# Patient Record
Sex: Male | Born: 1990
Health system: Southern US, Community
[De-identification: ages and names within clinical notes are randomized; demographics above are authoritative.]

## PROBLEM LIST (undated history)

## (undated) DIAGNOSIS — U071 COVID-19: Secondary | ICD-10-CM

## (undated) DIAGNOSIS — R7303 Prediabetes: Secondary | ICD-10-CM

## (undated) DIAGNOSIS — I10 Essential (primary) hypertension: Secondary | ICD-10-CM

## (undated) DIAGNOSIS — M109 Gout, unspecified: Secondary | ICD-10-CM

## (undated) HISTORY — DX: Gout, unspecified: M10.9

## (undated) HISTORY — DX: COVID-19: U07.1

## (undated) HISTORY — DX: Essential (primary) hypertension: I10

## (undated) HISTORY — DX: Prediabetes: R73.03

## (undated) HISTORY — PX: NO PAST SURGERIES: SHX2092

## (undated) HISTORY — DX: Morbid (severe) obesity due to excess calories: E66.01

---

## 2011-01-20 ENCOUNTER — Emergency Department: Payer: Self-pay | Admitting: Emergency Medicine

## 2018-07-21 ENCOUNTER — Emergency Department
Admission: EM | Admit: 2018-07-21 | Discharge: 2018-07-21 | Disposition: A | Discharge status: Home / Self Care | Payer: BLUE CROSS/BLUE SHIELD | Attending: Emergency Medicine | Admitting: Emergency Medicine

## 2018-07-21 ENCOUNTER — Encounter: Payer: Self-pay | Admitting: Emergency Medicine

## 2018-07-21 ENCOUNTER — Other Ambulatory Visit: Payer: Self-pay

## 2018-07-21 ENCOUNTER — Emergency Department: Payer: BLUE CROSS/BLUE SHIELD

## 2018-07-21 DIAGNOSIS — R0602 Shortness of breath: Secondary | ICD-10-CM | POA: Diagnosis not present

## 2018-07-21 DIAGNOSIS — I10 Essential (primary) hypertension: Secondary | ICD-10-CM | POA: Diagnosis not present

## 2018-07-21 DIAGNOSIS — Z8679 Personal history of other diseases of the circulatory system: Secondary | ICD-10-CM

## 2018-07-21 DIAGNOSIS — M10071 Idiopathic gout, right ankle and foot: Secondary | ICD-10-CM | POA: Insufficient documentation

## 2018-07-21 DIAGNOSIS — M79671 Pain in right foot: Secondary | ICD-10-CM | POA: Diagnosis not present

## 2018-07-21 DIAGNOSIS — M25476 Effusion, unspecified foot: Secondary | ICD-10-CM | POA: Diagnosis not present

## 2018-07-21 DIAGNOSIS — I509 Heart failure, unspecified: Secondary | ICD-10-CM

## 2018-07-21 LAB — CBC WITH DIFFERENTIAL/PLATELET
Abs Immature Granulocytes: 0.05 10*3/uL (ref 0.00–0.07)
Basophils Absolute: 0.1 10*3/uL (ref 0.0–0.1)
Basophils Relative: 0 %
Eosinophils Absolute: 0.2 10*3/uL (ref 0.0–0.5)
Eosinophils Relative: 2 %
HCT: 33.3 % — ABNORMAL LOW (ref 39.0–52.0)
Hemoglobin: 10.2 g/dL — ABNORMAL LOW (ref 13.0–17.0)
Immature Granulocytes: 0 %
Lymphocytes Relative: 24 %
Lymphs Abs: 2.9 10*3/uL (ref 0.7–4.0)
MCH: 23.2 pg — ABNORMAL LOW (ref 26.0–34.0)
MCHC: 30.6 g/dL (ref 30.0–36.0)
MCV: 75.9 fL — ABNORMAL LOW (ref 80.0–100.0)
Monocytes Absolute: 1.1 10*3/uL — ABNORMAL HIGH (ref 0.1–1.0)
Monocytes Relative: 10 %
Neutro Abs: 7.5 10*3/uL (ref 1.7–7.7)
Neutrophils Relative %: 64 %
Platelets: 313 10*3/uL (ref 150–400)
RBC: 4.39 MIL/uL (ref 4.22–5.81)
RDW: 16.5 % — ABNORMAL HIGH (ref 11.5–15.5)
WBC: 11.9 10*3/uL — ABNORMAL HIGH (ref 4.0–10.5)
nRBC: 0 % (ref 0.0–0.2)

## 2018-07-21 LAB — BASIC METABOLIC PANEL
Anion gap: 8 (ref 5–15)
BUN: 24 mg/dL — ABNORMAL HIGH (ref 6–20)
CO2: 27 mmol/L (ref 22–32)
Calcium: 8.8 mg/dL — ABNORMAL LOW (ref 8.9–10.3)
Chloride: 102 mmol/L (ref 98–111)
Creatinine, Ser: 1.51 mg/dL — ABNORMAL HIGH (ref 0.61–1.24)
GFR calc Af Amer: >60 mL/min (ref >60)
GFR calc non Af Amer: >60 mL/min (ref >60)
Glucose, Bld: 86 mg/dL (ref 70–99)
Potassium: 3.4 mmol/L — ABNORMAL LOW (ref 3.5–5.1)
Sodium: 137 mmol/L (ref 135–145)

## 2018-07-21 LAB — URIC ACID: Uric Acid, Serum: 9.1 mg/dL — ABNORMAL HIGH (ref 3.7–8.6)

## 2018-07-21 LAB — BRAIN NATRIURETIC PEPTIDE: B Natriuretic Peptide: 71 pg/mL (ref 0.0–100.0)

## 2018-07-21 MED ORDER — AMLODIPINE BESYLATE 5 MG PO TABS: 5.0000 mg | ORAL_TABLET | Freq: Every day | ORAL | 2 refills | Status: DC

## 2018-07-21 MED ORDER — HYDROCODONE-ACETAMINOPHEN 5-325 MG PO TABS: 1.0000 | ORAL_TABLET | Freq: Four times a day (QID) | ORAL | 0 refills | Status: AC | PRN

## 2018-07-21 MED ORDER — COLCHICINE 0.6 MG PO TABS
1.2000 mg | ORAL_TABLET | Freq: Once | ORAL | Status: AC
  Administered 2018-07-21: 1.2 mg via ORAL
  Filled 2018-07-21: qty 2

## 2018-07-21 MED ORDER — PREDNISONE 20 MG PO TABS: 20.0000 mg | ORAL_TABLET | Freq: Two times a day (BID) | ORAL | 0 refills | Status: AC

## 2018-07-21 MED ORDER — HYDROCODONE-ACETAMINOPHEN 5-325 MG PO TABS
1.0000 | ORAL_TABLET | Freq: Once | ORAL | Status: AC
  Administered 2018-07-21: 1 via ORAL
  Filled 2018-07-21: qty 1

## 2018-07-21 MED ORDER — COLCHICINE 0.6 MG PO TABS: ORAL_TABLET | ORAL | 0 refills | Status: DC

## 2018-07-21 NOTE — ED Triage Notes (Signed)
Patient presents to the ED with left foot swelling and pain since Friday.  Patient states he noticed swelling a few days prior.  Patient has a scratch to the top of his left foot that he states is a dog scratch from approx. 2 weeks ago.  Scratch does not appear reddened and does not have any drainage.

## 2018-07-21 NOTE — ED Provider Notes (Signed)
-----------------------------------------   6:47 PM on 07/21/2018 -----------------------------------------  This patient was seen with PA Menshew.  I personally evaluated the patient.  Patient presents with left foot pain, primarily to the dorsum of the foot and around the base of the great toe over the last several days and worse with bearing weight.  There is some swelling there.  He reports mild swelling on the other side but this is not significant.  He has no pain or swelling to the remainder of the leg.  On exam there is tenderness and some swelling to the dorsum of the foot and around the base of the great toe.  There is no erythema or induration.  He has no fluctuance or drainage, or any open wounds.  The remainder of the leg is nontender.  Overall I suspect most likely gout although we will obtain labs to help differentiate between this and possible cellulitis.  X-ray shows no fracture or other traumatic findings.  ----------------------------------------- 10:55 PM on 07/21/2018 -----------------------------------------  Lab work-up suggestive of gout.  The patient was sent home with medications for gout.  Return precautions given.   ED ECG REPORT I, Dionne Bucy, the attending physician, personally viewed and interpreted this ECG.  Date: 07/21/2018 EKG Time: 1629 Rate: 84 Rhythm: normal sinus rhythm QRS Axis: normal Intervals: normal ST/T Wave abnormalities: normal Narrative Interpretation: no evidence of acute ischemia    Dionne Bucy, MD 07/21/18 2256

## 2018-07-21 NOTE — ED Notes (Signed)
Pt instructed about medications for hypertension and gout.

## 2018-07-21 NOTE — ED Notes (Signed)
First Nurse Note: Pt to ED from Urgent Care for possible new onset heart failure. Pt is in NAD at this time.

## 2018-07-21 NOTE — ED Provider Notes (Signed)
[facility] Emergency Department Provider Note ____________________________________________  Time seen: 1555  I have reviewed the triage vital signs and the nursing notes.  HISTORY  Chief Complaint  Foot Swelling  HPI Darrell Campbell is a 28 y.o. male since himself to the ED for evaluation of sudden left foot pain noted 2 days ago.  Patient denies any trauma, injury, accident, or contusion.  He reports pain to the medial aspect of the foot at the great toe.  He has a new puppy, and had a scratch to the top of the foot, but does not endorse that the 2 are related.  He presents to the ED for evaluation of his foot pain.  Patient with a remote history of uncontrolled high blood pressure and questionable CHF/peripheral edema.  He has been without medical insurance for the last 5 years, and has had no interim medical care.  He presents now rating pain to the foot at an 8 out of 10 at this time.  He denies any fevers, chills, sweats, chest pain, or shortness of breath.  History reviewed. No pertinent past medical history.  There are no active problems to display for this patient.  History reviewed. No pertinent surgical history.  Prior to Admission medications   Not on File    Allergies Patient has no known allergies.  No family history on file.  Social History Social History   Tobacco Use  . Smoking status: Never Smoker  . Smokeless tobacco: Never Used  Substance Use Topics  . Alcohol use: Not on file  . Drug use: Not on file    Review of Systems  Constitutional: Negative for fever. Eyes: Negative for visual changes. ENT: Negative for sore throat. Cardiovascular: Negative for chest pain. Respiratory: Negative for shortness of breath. Gastrointestinal: Negative for abdominal pain, vomiting and diarrhea. Genitourinary: Negative for dysuria. Musculoskeletal: Negative for back pain.  Left foot pain as above. Skin: Negative for rash. Neurological:  Negative for headaches, focal weakness or numbness. ____________________________________________  PHYSICAL EXAM:  VITAL SIGNS: ED Triage Vitals  Enc Vitals Group     BP 07/21/18 1427 (!) 189/119     Pulse Rate 07/21/18 1427 89     Resp 07/21/18 1427 16     Temp 07/21/18 1427 98.7 F (37.1 C)     Temp Source 07/21/18 1427 Oral     SpO2 07/21/18 1427 98 %     Weight 07/21/18 1427 (!) 505 lb (229.1 kg)     Height 07/21/18 1427 6' (1.829 m)     Head Circumference --      Peak Flow --      Pain Score 07/21/18 1437 8     Pain Loc --      Pain Edu? --      Excl. in GC? --     Constitutional: Alert and oriented. Well appearing and in no distress. Head: Normocephalic and atraumatic. Eyes: Conjunctivae are normal. Normal extraocular movements Cardiovascular: Normal rate, regular rhythm. Normal distal pulses. Respiratory: Normal respiratory effort. No wheezes/rales/rhonchi. Gastrointestinal: Soft and nontender. No distention. Musculoskeletal: Nontender with normal range of motion in all extremities.  Neurologic:  Normal gait without ataxia. Normal speech and language. No gross focal neurologic deficits are appreciated. Skin:  Skin is warm, dry and intact. No rash noted. Psychiatric: Mood and affect are normal. Patient exhibits appropriate insight and judgment. ____________________________________________   LABS (pertinent positives/negatives) Labs Reviewed  BASIC METABOLIC PANEL - Abnormal; Notable for the following components:  Result Value   Potassium 3.4 (*)    BUN 24 (*)    Creatinine, Ser 1.51 (*)    Calcium 8.8 (*)    All other components within normal limits  CBC WITH DIFFERENTIAL/PLATELET - Abnormal; Notable for the following components:   WBC 11.9 (*)    Hemoglobin 10.2 (*)    HCT 33.3 (*)    MCV 75.9 (*)    MCH 23.2 (*)    RDW 16.5 (*)    Monocytes Absolute 1.1 (*)    All other components within normal limits  URIC ACID - Abnormal; Notable for the following  components:   Uric Acid, Serum 9.1 (*)    All other components within normal limits  BRAIN NATRIURETIC PEPTIDE  ____________________________________________  EKG  NSR 84 bpm PR Interval 150 ms QRS duration 96 ____________________________________________   RADIOLOGY  CXR  IMPRESSION: Cardiomegaly with pulmonary vascular congestion.  Right Foot  IMPRESSION: No fracture or dislocation is noted. Dorsal soft tissue swelling is seen suggesting infection or injury. ____________________________________________  PROCEDURES  Procedures ____________________________________________  INITIAL IMPRESSION / ASSESSMENT AND PLAN / ED COURSE  Darrell Campbell was evaluated in Emergency Department on 07/21/2018 for the symptoms described in the history of present illness. He was evaluated in the context of the global COVID-19 pandemic, which necessitated consideration that the patient might be at risk for infection with the SARS-CoV-2 virus that causes COVID-19. Institutional protocols and algorithms that pertain to the evaluation of patients at risk for COVID-19 are in a state of rapid change based on information released by regulatory bodies including the CDC and federal and state organizations. These policies and algorithms were followed during the patient's care in the ED.  Patient presents to the ED for evaluation of right foot pain for 2 days.  Patient's clinical exam is concerning for cellulitis versus acute gouty arthritis.  Labs are reassuring except an elevated uric acid of 9.1.  Patient will be treated empirically for acute gouty arthritis with a prescription for colchicine.  He is also given advised to select and follow with the primary provider for routine medical care including management of his blood pressure, fluid retention, and suspected  CHF.  ____________________________________________  FINAL CLINICAL IMPRESSION(S) / ED DIAGNOSES  Final diagnoses:  Acute idiopathic gout of  right foot      Karmen StabsMenshew, Charlesetta IvoryJenise V Bacon, PA-C 07/21/18 1901    Dionne BucySiadecki, Sebastian, MD 07/21/18 2254

## 2018-07-21 NOTE — Discharge Instructions (Addendum)
Your foot pain is due to gout. Take the prescription meds as directed. Select & Follow-up with a primary provider for routine medical care.

## 2018-07-30 ENCOUNTER — Other Ambulatory Visit: Payer: Self-pay

## 2018-07-30 ENCOUNTER — Ambulatory Visit (INDEPENDENT_AMBULATORY_CARE_PROVIDER_SITE_OTHER): Payer: Self-pay | Admitting: Family Medicine

## 2018-07-30 ENCOUNTER — Encounter: Payer: Self-pay | Admitting: Family Medicine

## 2018-07-30 ENCOUNTER — Telehealth: Payer: Self-pay | Admitting: Family Medicine

## 2018-07-30 DIAGNOSIS — I10 Essential (primary) hypertension: Secondary | ICD-10-CM | POA: Insufficient documentation

## 2018-07-30 DIAGNOSIS — M109 Gout, unspecified: Secondary | ICD-10-CM

## 2018-07-30 MED ORDER — COLCHICINE 0.6 MG PO TABS: 0.6000 mg | ORAL_TABLET | Freq: Every day | ORAL | 2 refills | Status: DC

## 2018-07-30 NOTE — Progress Notes (Signed)
Patient ID: Darrell Campbell, male   DOB: 07-29-1990, 28 y.o.   MRN: [id number]    Virtual Visit via video Note  This visit type was conducted due to national recommendations for restrictions regarding the COVID-19 pandemic (e.g. social distancing).  This format is felt to be most appropriate for this patient at this time.  All issues noted in this document were discussed and addressed.  No physical exam was performed (except for noted visual exam findings with Video Visits).   I connected with Darrell Campbell today at 11:20 AM EDT by a video enabled telemedicine application and verified that I am speaking with the correct person using two identifiers. Location patient: home Location provider: [facility] Persons participating in the virtual visit: patient, provider  I discussed the limitations, risks, security and privacy concerns of performing an evaluation and management service by video and the availability of in person appointments. I also discussed with the patient that there may be a patient responsible charge related to this service. The patient expressed understanding and agreed to proceed.  HPI:  Patient not connected via video for patient to establish with PCP.  Patient has not had a primary care provider in a little over 6 or 7 years.  He had previously been on blood pressure medication, but lost medical insurance and was unable to provide medical care in his medication.  Last week he went to the emergency department due to painful right foot, and was diagnosed with gout in right foot.  He was treated with course of colchicine which did seem to help but got improved.  He was advised by provider in ER to take colchicine once daily after the first dose of colchicine to treat gout if pain began to research.  Pain in right foot slowly begin to come back, so he is begun taking colchicine once daily and this seems to have helped keep pain in right foot at day and he is back to walking  almost normally without pain.  When in the ER it was also discovered his blood pressure was quite elevated.  Patient unsure of what medication he took in the past, but was started on amlodipine 5 mg daily by the ER physician.  Patient also has family history of high blood pressure in both mother and father.  Patient denies any chest pain or palpitations.  Denies headache.  Denies shortness of breath or wheezing.  Does have leg swelling at times, ER doctor suspected possibly underlying CHF as well as uncontrolled blood pressure.  Lab work and EKG done in ER reviewed.  Patient does not have BP cuff at home and is unable to check his blood pressure for me today.   ROS:    Constitutional: Negative for chills, fatigue and fever.  HENT: Negative for congestion, ear pain, sinus pain and sore throat.   Eyes: Negative.   Respiratory: Negative for cough, shortness of breath and wheezing.   Cardiovascular: Negative for chest pain, palpitations and leg swelling.  Gastrointestinal: Negative for abdominal pain, diarrhea, nausea and vomiting.  Genitourinary: Negative for dysuria, frequency and urgency.  Musculoskeletal: Pain in right foot, slowly improving.   Skin: Negative for color change, pallor and rash.  Neurological: Negative for syncope, light-headedness and headaches.  Psychiatric/Behavioral: The patient is not nervous/anxious.     Past Medical History:  Diagnosis Date  . Gout   . Hypertension     History reviewed. No pertinent surgical history.  Family History  Problem Relation Age  of Onset  . Bronchitis Mother   . Hypertension Father    Social History   Tobacco Use  . Smoking status: Never Smoker  . Smokeless tobacco: Never Used  Substance Use Topics  . Alcohol use: Yes    Comment: socially    Current Outpatient Medications:  .  amLODipine (NORVASC) 5 MG tablet, Take 1 tablet (5 mg total) by mouth daily., Disp: 30 tablet, Rfl: 2 .  colchicine 0.6 MG tablet, Take 1 tablet  (0.6 mg total) by mouth daily., Disp: 30 tablet, Rfl: 2  EXAM:  Wt Readings from Last 3 Encounters:  07/21/18 (!) 505 lb (229.1 kg)   BP Readings from Last 3 Encounters:  07/21/18 (!) 185/108   GENERAL: alert, oriented, appears well and in no acute distress  HEENT: atraumatic, conjunttiva clear, no obvious abnormalities on inspection of external nose and ears  NECK: normal movements of the head and neck  LUNGS: on inspection no signs of respiratory distress, breathing rate appears normal, no obvious gross SOB, gasping or wheezing  CV: no obvious cyanosis  MS: moves all visible extremities without noticeable abnormality  PSYCH/NEURO: pleasant and cooperative, no obvious depression or anxiety, speech and thought processing grossly intact  ASSESSMENT AND PLAN:  Discussed the following assessment and plan:  Gout of foot, unspecified cause, unspecified chronicity, unspecified laterality - Plan: colchicine 0.6 MG tablet, DISCONTINUED: colchicine 0.6 MG tablet  Essential hypertension - Plan: DME Other see comment  Morbid obesity (HCC)  For now patient will continue colchicine once daily to help keep gout pain under control.  He will continue amlodipine 5 mg daily for blood pressure control.  We will have him come in the office next week for an office visit so we can check his blood pressure and recheck blood work so we can compare results to labs checked in the emergency department.  Also discussed healthy diet and physical activity with goal of weight loss and better control of BP. I also sent over Rx for BP cuff so patient can check BP at home. Recommended diet full of lean protein such as chicken and fish, lots of green vegetables and fruits and low carbs and low processed sugars.  Discussed physical activity such as going for walks and also some weight training beginning with small hand weights and building up stamina over time.   I discussed the assessment and treatment plan with  the patient. The patient was provided an opportunity to ask questions and all were answered. The patient agreed with the plan and demonstrated an understanding of the instructions.   The patient was advised to call back or seek an in-person evaluation if the symptoms worsen or if the condition fails to improve as anticipated.  Patient will follow-up in office next week so we can check blood pressure, do in person exam & get labs   Tracey Harries, FNP

## 2018-07-30 NOTE — Telephone Encounter (Signed)
Appt scheduled

## 2018-07-30 NOTE — Telephone Encounter (Signed)
Please schedule him on Tuesday 08/06/2018 at 1120 AM for in office visit with me and we will also do labs than day  Thanks  LG

## 2018-08-06 ENCOUNTER — Other Ambulatory Visit: Payer: Self-pay

## 2018-08-06 ENCOUNTER — Telehealth: Payer: Self-pay

## 2018-08-06 ENCOUNTER — Other Ambulatory Visit: Payer: Self-pay | Admitting: Family Medicine

## 2018-08-06 ENCOUNTER — Telehealth: Payer: Self-pay | Admitting: Family Medicine

## 2018-08-06 ENCOUNTER — Ambulatory Visit (INDEPENDENT_AMBULATORY_CARE_PROVIDER_SITE_OTHER): Payer: BLUE CROSS/BLUE SHIELD | Admitting: Family Medicine

## 2018-08-06 ENCOUNTER — Encounter: Payer: Self-pay | Admitting: Family Medicine

## 2018-08-06 DIAGNOSIS — I10 Essential (primary) hypertension: Secondary | ICD-10-CM

## 2018-08-06 DIAGNOSIS — D649 Anemia, unspecified: Secondary | ICD-10-CM

## 2018-08-06 DIAGNOSIS — Z713 Dietary counseling and surveillance: Secondary | ICD-10-CM

## 2018-08-06 DIAGNOSIS — E559 Vitamin D deficiency, unspecified: Secondary | ICD-10-CM

## 2018-08-06 DIAGNOSIS — M109 Gout, unspecified: Secondary | ICD-10-CM

## 2018-08-06 DIAGNOSIS — D509 Iron deficiency anemia, unspecified: Secondary | ICD-10-CM

## 2018-08-06 DIAGNOSIS — E538 Deficiency of other specified B group vitamins: Secondary | ICD-10-CM

## 2018-08-06 LAB — CBC WITH DIFFERENTIAL/PLATELET
Basophils Absolute: 0.1 10*3/uL (ref 0.0–0.1)
Basophils Relative: 0.8 % (ref 0.0–3.0)
Eosinophils Absolute: 0.2 10*3/uL (ref 0.0–0.7)
Eosinophils Relative: 2 % (ref 0.0–5.0)
HCT: 34.2 % — ABNORMAL LOW (ref 39.0–52.0)
Hemoglobin: 10.9 g/dL — ABNORMAL LOW (ref 13.0–17.0)
Lymphocytes Relative: 23 % (ref 12.0–46.0)
Lymphs Abs: 2.2 10*3/uL (ref 0.7–4.0)
MCHC: 31.8 g/dL (ref 30.0–36.0)
MCV: 75.4 fl — ABNORMAL LOW (ref 78.0–100.0)
Monocytes Absolute: 0.8 10*3/uL (ref 0.1–1.0)
Monocytes Relative: 8.3 % (ref 3.0–12.0)
Neutro Abs: 6.2 10*3/uL (ref 1.4–7.7)
Neutrophils Relative %: 65.9 % (ref 43.0–77.0)
Platelets: 252 10*3/uL (ref 150.0–400.0)
RBC: 4.54 Mil/uL (ref 4.22–5.81)
RDW: 17.9 % — ABNORMAL HIGH (ref 11.5–15.5)
WBC: 9.4 10*3/uL (ref 4.0–10.5)

## 2018-08-06 LAB — BASIC METABOLIC PANEL
BUN: 19 mg/dL (ref 6–23)
CO2: 32 mEq/L (ref 19–32)
Calcium: 9.3 mg/dL (ref 8.4–10.5)
Chloride: 101 mEq/L (ref 96–112)
Creatinine, Ser: 1.55 mg/dL — ABNORMAL HIGH (ref 0.40–1.50)
GFR: 63.91 mL/min (ref >60.00)
Glucose, Bld: 88 mg/dL (ref 70–99)
Potassium: 3.8 mEq/L (ref 3.5–5.1)
Sodium: 140 mEq/L (ref 135–145)

## 2018-08-06 LAB — LIPID PANEL
Cholesterol: 183 mg/dL (ref 0–200)
HDL: 45.6 mg/dL (ref >39.00)
LDL Cholesterol: 121 mg/dL — ABNORMAL HIGH (ref 0–99)
NonHDL: 137.4
Total CHOL/HDL Ratio: 4
Triglycerides: 83 mg/dL (ref 0.0–149.0)
VLDL: 16.6 mg/dL (ref 0.0–40.0)

## 2018-08-06 LAB — TSH: TSH: 3.01 u[IU]/mL (ref 0.35–4.50)

## 2018-08-06 LAB — IBC + FERRITIN
Ferritin: 71.6 ng/mL (ref 22.0–322.0)
Iron: 30 ug/dL — ABNORMAL LOW (ref 42–165)
Saturation Ratios: 10.8 % — ABNORMAL LOW (ref 20.0–50.0)
Transferrin: 198 mg/dL — ABNORMAL LOW (ref 212.0–360.0)

## 2018-08-06 LAB — HEPATIC FUNCTION PANEL
ALT: 21 U/L (ref 0–53)
AST: 12 U/L (ref 0–37)
Albumin: 3.8 g/dL (ref 3.5–5.2)
Alkaline Phosphatase: 86 U/L (ref 39–117)
Bilirubin, Direct: 0.1 mg/dL (ref 0.0–0.3)
Total Bilirubin: 0.4 mg/dL (ref 0.2–1.2)
Total Protein: 7.3 g/dL (ref 6.0–8.3)

## 2018-08-06 LAB — HEMOGLOBIN A1C: Hgb A1c MFr Bld: 6 % (ref 4.6–6.5)

## 2018-08-06 LAB — B12 AND FOLATE PANEL
Folate: 5 ng/mL — ABNORMAL LOW (ref >5.9)
Vitamin B-12: 225 pg/mL (ref 211–911)

## 2018-08-06 LAB — VITAMIN D 25 HYDROXY (VIT D DEFICIENCY, FRACTURES): VITD: <7 ng/mL — ABNORMAL LOW (ref 30.00–100.00)

## 2018-08-06 LAB — URIC ACID: Uric Acid, Serum: 8.7 mg/dL — ABNORMAL HIGH (ref 4.0–7.8)

## 2018-08-06 MED ORDER — HYDROCHLOROTHIAZIDE 25 MG PO TABS: 25.0000 mg | ORAL_TABLET | Freq: Every day | ORAL | 2 refills | Status: DC

## 2018-08-06 MED ORDER — METOPROLOL SUCCINATE ER 50 MG PO TB24: 50.0000 mg | ORAL_TABLET | Freq: Every day | ORAL | 2 refills | Status: DC

## 2018-08-06 MED ORDER — AMLODIPINE BESYLATE 10 MG PO TABS: 10.0000 mg | ORAL_TABLET | Freq: Every day | ORAL | 2 refills | Status: DC

## 2018-08-06 NOTE — Telephone Encounter (Signed)
Visit supposed to be in person  We need to check his BP and get labs  Please call to confirm in person, covid screening

## 2018-08-06 NOTE — Telephone Encounter (Signed)
Copied from CRM [id number]. Topic: General - Other >> Aug 06, 2018 11:15 AM Jay Schlichter wrote: Reason for CRM: pt called to let us know he is running 5 mins late for his 1120 appt.  He said he will not be more than 10 mins late  Attempted to call office, no answer

## 2018-08-06 NOTE — Progress Notes (Signed)
Rx for BP meds  See office visit note

## 2018-08-06 NOTE — Progress Notes (Signed)
Subjective:    Patient ID: Darrell Campbell, male    DOB: 1990-10-11, 28 y.o.   MRN: [id number]  HPI   Patient presents to clinic today for follow-up on blood pressure after being on amlodipine for a little over a week and also for follow-up on foot pain.  Patient states he is tolerating the amlodipine without any issues.  Denies any increased leg swelling or palpitations/chest pain.  Right foot pain has improved.  He is taking colchicine at low dose daily.  Patient Active Problem List   Diagnosis Date Noted  . Essential hypertension 07/30/2018  . Morbid obesity (HCC) 07/30/2018   Social History   Tobacco Use  . Smoking status: Never Smoker  . Smokeless tobacco: Never Used  Substance Use Topics  . Alcohol use: Yes    Comment: socially   Family History  Problem Relation Age of Onset  . Bronchitis Mother   . Hypertension Father     Review of Systems  Constitutional: Negative for chills, fatigue and fever.  HENT: Negative for congestion, ear pain, sinus pain and sore throat.   Eyes: Negative.   Respiratory: Negative for cough, shortness of breath and wheezing.   Cardiovascular: Negative for chest pain, palpitations and leg swelling.  Gastrointestinal: Negative for abdominal pain, diarrhea, nausea and vomiting.  Genitourinary: Negative for dysuria, frequency and urgency.  Musculoskeletal: Negative for arthralgias and myalgias. Pain in R foot improved.  Skin: Negative for color change, pallor and rash.  Neurological: Negative for syncope, light-headedness and headaches.  Psychiatric/Behavioral: The patient is not nervous/anxious.       Objective:   Physical Exam Vitals signs and nursing note reviewed.  Constitutional:      General: He is not in acute distress.    Appearance: He is obese. He is not ill-appearing or diaphoretic.  HENT:     Head: Normocephalic and atraumatic.  Eyes:     General: No scleral icterus.    Extraocular Movements: Extraocular movements  intact.     Pupils: Pupils are equal, round, and reactive to light.  Cardiovascular:     Rate and Rhythm: Regular rhythm. Tachycardia present.     Heart sounds: No murmur. No friction rub. No gallop.   Pulmonary:     Effort: Pulmonary effort is normal. No respiratory distress.     Breath sounds: Normal breath sounds.  Musculoskeletal:     Comments: ROM feet/ankles/toes normal  Skin:    General: Skin is warm and dry.     Coloration: Skin is not jaundiced or pale.     Findings: No erythema or rash.  Neurological:     General: No focal deficit present.     Mental Status: He is alert and oriented to person, place, and time.     Gait: Gait normal.  Psychiatric:        Mood and Affect: Mood normal.        Behavior: Behavior normal.        Thought Content: Thought content normal.    Vitals:   08/06/18 1156 08/06/18 1200  BP: (!) 188/130 (!) 170/112  Pulse: (!) 112   Resp: 20   Temp: 99.9 F (37.7 C)   SpO2: 94%    Wt Readings from Last 3 Encounters:  08/06/18 (!) 510 lb 9.6 oz (231.6 kg)  07/21/18 (!) 505 lb (229.1 kg)   Body mass index is 73.26 kg/m.     Assessment & Plan:    A total of 40  minutes were spent face-to-face with the patient during this encounter and over half of that time was spent on counseling and coordination of care. The patient was counseled on blood pressure medication changes, weight loss strategies and diet plan to follow.  Morbid obesity- Long discussion with patient regards to his weight and how weight loss will help improve his overall health.  Discussed a diet high in lean proteins, lots of vegetables and lower in sugars and carbs.  Patient given outline of a diet plan to try and follow that will help promote weight loss.  Patient aware that if he continues at his current weight, he most likely will develop more and more chronic medical issues.  Hypertension-BP continues to remain elevated even with amlodipine 5 mg.  We will increase dose to 10  mg/day.  We will also add on hydrochlorothiazide 25 mg daily as well as Toprol 50 mg daily.  Anemia- patient's blood work in the ER did reveal some anemia.  Patient reports a history of iron deficiency.  We will repeat labs and also include iron panel.  Gout of right foot-pain in right foot improved.  We will follow-up uric acid level with blood work and he will continue Colcrys as prescribed.  Patient will follow-up in here in 2 weeks for recheck on blood pressure with addition of new medications.

## 2018-08-06 NOTE — Patient Instructions (Signed)
Increase Amlodipine to 10 mg per day  Add HCTZ 25 mg daily for swelling and BP control  Add Toprol XL 50 mg daily for BP  This is  Dr. Melina Schools  example of a  "Low GI"  Diet:  It will allow you to lose 4 to 8  lbs  per month if you follow it carefully.  Your goal with exercise is a minimum of 30 minutes of aerobic exercise 5 days per week (Walking does not count once it becomes easy!)     All of the foods can be found at grocery stores and in bulk at [employer].  The Atkins protein bars and shakes are available in more varieties at Target, [employer] and [employer].       7 AM Breakfast:  Choose from the following:             Low carbohydrate Protein  Shakes (I recommend the  Premier Protein chocolate shakes,  EAS AdvantEdge "Carb Control" shakes  Or the Atkins shakes all are under 3 net carbs)                           a scrambled egg/bacon/cheese burrito made with Mission's "carb balance" whole wheat tortilla  (about 10 net carbs )             Medical laboratory scientific officer (basically a quiche without the pastry crust) that is eaten cold and very convenient way to get your eggs.  8 carbs)             If you make your own protein shakes, avoid bananas and pineapple,  And use low carb greek yogurt or original /unsweetened almond or soy milk     Avoid cereal and bananas, oatmeal and cream of wheat and grits. They are loaded with carbohydrates!     10 AM: high protein snack:             Protein bar by Atkins (the snack size, under 200 cal, usually < 6 net carbs).               A stick of cheese:  Around 1 carb,  100 cal                Dannon Light n Fit [place] Yogurt  (80 cal, 8 carbs)  Other so called "protein bars" and Greek yogurts tend to be loaded with carbohydrates.  Remember, in food advertising, the word "energy" is synonymous for " carbohydrate."   Lunch:              A Sandwich using the bread choices listed, Can use any  Eggs,  lunchmeat, grilled meat or canned tuna),  avocado, regular mayo/mustard  and cheese.             A Salad using blue cheese, ranch,  Goddess or vinagrette,  Avoid taco shells, croutons or "confetti" and no "candied nuts" but regular nuts OK.              No pretzels, nabs  or chips.  Pickles and miniature sweet peppers are a good low carb alternative that provide a "crunch"  The bread is the only source of carbohydrate in a sandwich and  can be decreased by trying some of the attached alternatives to traditional loaf bread              Avoid "Low fat dressings, as well as  Reyne Dumas[place] and [place] dressings They are loaded with sugar!     3 PM/ Mid day  Snack:             Consider  1 ounce of  almonds, walnuts, pistachios, pecans, peanuts,  Macadamia nuts or a nut medley.  Avoid "granola and granola bars "  Mixed nuts are ok in moderation as long as there are no raisins,  cranberries or dried fruit.   KIND bars are OK if you get the low glycemic index variety   Try the prosciutto/mozzarella cheese sticks by Fiorruci  In deli /backery section   High protein                 6 PM  Dinner:                Meat/fowl/fish with a green salad, and either broccoli, cauliflower, green beans, spinach, brussel sprouts or  Lima beans. DO NOT BREAD THE PROTEIN!!               There is a low carb pasta by Dreamfield's that is acceptable and tastes great: only 5 digestible carbs/serving.( All grocery stores but BJs carry it ) Several ready made meals are available low carb:              Try Michel Angelo's chicken piccata or chicken or eggplant parm over low carb pasta.(Lowes and BJs)              Clifton CustardAaron Sanchez's "Carnitas" (pulled pork, no sauce,  0 carbs) or his beef pot roast to make a dinner burrito (at [employer]J's)             Pesto over low carb pasta (bj's sells a good quality pesto in the center refrigerated section of the deli              Try satueeing  Roosvelt HarpsBok Choy with mushroooms as a good side              Green Giant makes a mashed cauliflower  that tastes like mashed potatoes   Whole wheat pasta is still full of digestible carbs and  Not as low in glycemic index as Dreamfield's.   Brown rice is still rice,  So skip the rice and noodles if you eat Congohinese or [place] (or at least limit to 1/2 cup)   9 PM snack :              Breyer's "low carb" fudgsicle or  ice cream bar (Carb Smart line), or  Weight Watcher's ice cream bar , or another "no sugar added" ice cream;             a serving of fresh berries/cherries with whipped cream              Cheese or DANNON'S LlGHT N FIT GREEK YOGURT             8 ounces of [place] unsweetened almond/cococunut milk               Treat yourself to a parfait made with whipped cream blueberiies, walnuts and vanilla greek yogurt   Avoid bananas, pineapple, grapes  and watermelon on a regular basis because they are high in sugar.  THINK OF THEM AS DESSERT   Remember that snack Substitutions should be less than 10 NET carbs per serving and meals < 20 carbs. Remember to subtract fiber grams to get the "net carbs."

## 2018-08-07 MED ORDER — FOLIC ACID 400 MCG PO TABS: 400.0000 ug | ORAL_TABLET | Freq: Every day | ORAL | 1 refills | Status: DC

## 2018-08-07 MED ORDER — VITAMIN D (ERGOCALCIFEROL) 1.25 MG (50000 UNIT) PO CAPS: 50000.0000 [IU] | ORAL_CAPSULE | ORAL | 1 refills | Status: DC

## 2018-08-07 MED ORDER — FERROUS SULFATE 325 (65 FE) MG PO TABS: 325.0000 mg | ORAL_TABLET | Freq: Every day | ORAL | 1 refills | Status: DC

## 2018-08-07 NOTE — Addendum Note (Signed)
Addended by: Leanora Cover on: 08/07/2018 12:52 PM   Modules accepted: Orders

## 2018-08-20 ENCOUNTER — Encounter: Payer: Self-pay | Admitting: Family Medicine

## 2018-08-20 ENCOUNTER — Ambulatory Visit (INDEPENDENT_AMBULATORY_CARE_PROVIDER_SITE_OTHER): Payer: BLUE CROSS/BLUE SHIELD | Admitting: Family Medicine

## 2018-08-20 ENCOUNTER — Other Ambulatory Visit: Payer: Self-pay

## 2018-08-20 VITALS — BP 148/102 | HR 103 | Temp 99.7°F | Resp 20 | Ht 70.5 in | Wt >= 6400 oz

## 2018-08-20 DIAGNOSIS — Z713 Dietary counseling and surveillance: Secondary | ICD-10-CM | POA: Diagnosis not present

## 2018-08-20 DIAGNOSIS — I10 Essential (primary) hypertension: Secondary | ICD-10-CM

## 2018-08-20 MED ORDER — METOPROLOL SUCCINATE ER 100 MG PO TB24: 100.0000 mg | ORAL_TABLET | Freq: Every day | ORAL | 2 refills | Status: DC

## 2018-08-20 NOTE — Progress Notes (Signed)
Subjective:    Patient ID: Darrell Campbell, male    DOB: [birth year], 28 y.o.   MRN: [id number]  HPI   Patient presents to clinic for follow-up on blood pressure after addition of Toprol-XL and 25 mg to amlodipine regimen.  Patient is tolerating these new medications without any adverse effects.  Denies any chest pain, palpitations and also states swelling in legs seems to be improved.  He is continuing to try to eat healthier diet and be more physically active.  Patient Active Problem List   Diagnosis Date Noted  . Essential hypertension 07/30/2018  . Morbid obesity (HCC) 07/30/2018   Social History   Tobacco Use  . Smoking status: Never Smoker  . Smokeless tobacco: Never Used  Substance Use Topics  . Alcohol use: Yes    Comment: socially   Review of Systems  Constitutional: Negative for chills, fatigue and fever.  HENT: Negative for congestion, ear pain, sinus pain and sore throat.   Eyes: Negative.   Respiratory: Negative for cough, shortness of breath and wheezing.   Cardiovascular: Negative for chest pain, palpitations and leg swelling.  Gastrointestinal: Negative for abdominal pain, diarrhea, nausea and vomiting.  Genitourinary: Negative for dysuria, frequency and urgency.  Musculoskeletal: Negative for arthralgias and myalgias.  Skin: Negative for color change, pallor and rash.  Neurological: Negative for syncope, light-headedness and headaches.  Psychiatric/Behavioral: The patient is not nervous/anxious.       Objective:   Physical Exam Vitals signs and nursing note reviewed.  Constitutional:      General: He is not in acute distress.    Appearance: He is obese. He is not ill-appearing, toxic-appearing or diaphoretic.  HENT:     Head: Normocephalic and atraumatic.  Eyes:     General: No scleral icterus.    Extraocular Movements: Extraocular movements intact.     Pupils: Pupils are equal, round, and reactive to light.  Neck:     Musculoskeletal: Neck  supple. No neck rigidity.     Vascular: No carotid bruit.  Cardiovascular:     Rate and Rhythm: Regular rhythm. Tachycardia present.  Pulmonary:     Effort: Pulmonary effort is normal. No respiratory distress.     Breath sounds: Normal breath sounds. No wheezing, rhonchi or rales.  Musculoskeletal:     Right lower leg: Edema (+1) present.     Left lower leg: Edema (+1) present.  Skin:    General: Skin is warm and dry.     Coloration: Skin is not jaundiced or pale.  Neurological:     Mental Status: He is alert and oriented to person, place, and time.  Psychiatric:        Mood and Affect: Mood normal.        Behavior: Behavior normal.    Today's Vitals   08/20/18 1109  BP: (!) 148/102  Pulse: (!) 103  Resp: 20  Temp: 99.7 F (37.6 C)  TempSrc: Oral  SpO2: 95%  Weight: (!) 515 lb 12.8 oz (234 kg)  Height: 5' 10.5" (1.791 m)   Body mass index is 72.96 kg/m.   BP Readings from Last 3 Encounters:  08/20/18 (!) 148/102  08/06/18 (!) 170/112  07/21/18 (!) 185/108      Assessment & Plan:    Essential hypertension-patient's BP is improved from previous visit, but can be better.  We will increase dose of Toprol to 100 mg daily and continue Norvasc 10 mg daily and hydrochlorothiazide 25 mg daily.  Discussed referral  to cardiology & getting Echocardiogram, patient would like to hold off at this time but states will consider and probably will do later on this summer.  Morbid obesity/weight loss counseling-again reviewed healthy diet and regular physical activity.  Discussed a diet high in vegetables, lean proteins and lower in sugars and processed carbohydrates.  Advised keeping up good water intake and avoiding beverages that are packed with sugar and calories.  Recommended walking and lifting small weights to help build up physical stamina and then slowly advance physical activity as tolerated.  Patient will follow-up here in about 4 weeks for recheck after addition of new BP  medication.  Advised to call office anytime with questions or concerns.

## 2018-08-20 NOTE — Patient Instructions (Signed)
Take 2 tablets of the 50mg  toprol XL daily to use up supply then pick up the 100 mg dose

## 2018-09-17 ENCOUNTER — Ambulatory Visit (INDEPENDENT_AMBULATORY_CARE_PROVIDER_SITE_OTHER): Payer: BC Managed Care – PPO | Admitting: Family Medicine

## 2018-09-17 ENCOUNTER — Other Ambulatory Visit: Payer: Self-pay

## 2018-09-17 ENCOUNTER — Encounter: Payer: Self-pay | Admitting: Family Medicine

## 2018-09-17 VITALS — BP 132/96 | HR 90 | Temp 99.1°F | Resp 20 | Ht 70.0 in | Wt >= 6400 oz

## 2018-09-17 DIAGNOSIS — I10 Essential (primary) hypertension: Secondary | ICD-10-CM

## 2018-09-17 DIAGNOSIS — Z713 Dietary counseling and surveillance: Secondary | ICD-10-CM

## 2018-09-17 NOTE — Patient Instructions (Signed)
Keep up the great work!!   Exercising to Lose Weight Exercise is structured, repetitive physical activity to improve fitness and health. Getting regular exercise is important for everyone. It is especially important if you are overweight. Being overweight increases your risk of heart disease, stroke, diabetes, high blood pressure, and several types of cancer. Reducing your calorie intake and exercising can help you lose weight. Exercise is usually categorized as moderate or vigorous intensity. To lose weight, most people need to do a certain amount of moderate-intensity or vigorous-intensity exercise each week. Moderate-intensity exercise  Moderate-intensity exercise is any activity that gets you moving enough to burn at least three times more energy (calories) than if you were sitting. Examples of moderate exercise include:  Walking a mile in 15 minutes.  Doing light yard work.  Biking at an easy pace. Most people should get at least 150 minutes (2 hours and 30 minutes) a week of moderate-intensity exercise to maintain their body weight. Vigorous-intensity exercise Vigorous-intensity exercise is any activity that gets you moving enough to burn at least six times more calories than if you were sitting. When you exercise at this intensity, you should be working hard enough that you are not able to carry on a conversation. Examples of vigorous exercise include:  Running.  Playing a team sport, such as football, basketball, and soccer.  Jumping rope. Most people should get at least 75 minutes (1 hour and 15 minutes) a week of vigorous-intensity exercise to maintain their body weight. How can exercise affect me? When you exercise enough to burn more calories than you eat, you lose weight. Exercise also reduces body fat and builds muscle. The more muscle you have, the more calories you burn. Exercise also:  Improves mood.  Reduces stress and tension.  Improves your overall fitness,  flexibility, and endurance.  Increases bone strength. The amount of exercise you need to lose weight depends on:  Your age.  The type of exercise.  Any health conditions you have.  Your overall physical ability. Talk to your health care provider about how much exercise you need and what types of activities are safe for you. What actions can I take to lose weight? Nutrition   Make changes to your diet as told by your health care provider or diet and nutrition specialist (dietitian). This may include: ? Eating fewer calories. ? Eating more protein. ? Eating less unhealthy fats. ? Eating a diet that includes fresh fruits and vegetables, whole grains, low-fat dairy products, and lean protein. ? Avoiding foods with added fat, salt, and sugar.  Drink plenty of water while you exercise to prevent dehydration or heat stroke. Activity  Choose an activity that you enjoy and set realistic goals. Your health care provider can help you make an exercise plan that works for you.  Exercise at a moderate or vigorous intensity most days of the week. ? The intensity of exercise may vary from person to person. You can tell how intense a workout is for you by paying attention to your breathing and heartbeat. Most people will notice their breathing and heartbeat get faster with more intense exercise.  Do resistance training twice each week, such as: ? Push-ups. ? Sit-ups. ? Lifting weights. ? Using resistance bands.  Getting short amounts of exercise can be just as helpful as long structured periods of exercise. If you have trouble finding time to exercise, try to include exercise in your daily routine. ? Get up, stretch, and walk around every 30 minutes  throughout the day. ? Go for a walk during your lunch break. ? Park your car farther away from your destination. ? If you take public transportation, get off one stop early and walk the rest of the way. ? Make phone calls while standing up and  walking around. ? Take the stairs instead of elevators or escalators.  Wear comfortable clothes and shoes with good support.  Do not exercise so much that you hurt yourself, feel dizzy, or get very short of breath. Where to find more information  U.S. Department of Health and Human Services: ThisPath.fiwww.hhs.gov  Centers for Disease Control and Prevention (CDC): FootballExhibition.com.brwww.cdc.gov Contact a health care provider:  Before starting a new exercise program.  If you have questions or concerns about your weight.  If you have a medical problem that keeps you from exercising. Get help right away if you have any of the following while exercising:  Injury.  Dizziness.  Difficulty breathing or shortness of breath that does not go away when you stop exercising.  Chest pain.  Rapid heartbeat. Summary  Being overweight increases your risk of heart disease, stroke, diabetes, high blood pressure, and several types of cancer.  Losing weight happens when you burn more calories than you eat.  Reducing the amount of calories you eat in addition to getting regular moderate or vigorous exercise each week helps you lose weight. This information is not intended to replace advice given to you by your health care provider. Make sure you discuss any questions you have with your health care provider. Document Released: 04/01/2010 Document Revised: 03/12/2017 Document Reviewed: 03/12/2017 Elsevier Patient Education  2020 [employer]

## 2018-09-17 NOTE — Progress Notes (Signed)
Subjective:    Patient ID: Darrell Campbell, male    DOB: Feb 21, 1991, 28 y.o.   MRN: [id number]  HPI   Patient presents to clinic for follow-up on blood pressure and weight loss since medication changes.  He is tolerating blood pressure medicines without any adverse effects.  Denies any chest pain, shortness of breath, palpitations, wheezing.  Feels combination of Toprol, amlodipine and hydrochlorothiazide is working well to control his blood pressure.  Patient is also working on dietary changes.  He is staying away from salty and processed foods and working on incorporating more fruits and vegetables into his diet and choosing lower carb options.  He is also monitoring calorie intake.  He has been walking the dog at least 3-4 times a week and been going swimming to keep himself active.  He is hoping to increase his physical activity as tolerated.  Patient Active Problem List   Diagnosis Date Noted  . Essential hypertension 07/30/2018  . Morbid obesity (HCC) 07/30/2018   Social History   Tobacco Use  . Smoking status: Never Smoker  . Smokeless tobacco: Never Used  Substance Use Topics  . Alcohol use: Yes    Comment: socially   Review of Systems  Constitutional: Negative for chills, fatigue and fever.  HENT: Negative for congestion, ear pain, sinus pain and sore throat.   Eyes: Negative.   Respiratory: Negative for cough, shortness of breath and wheezing.   Cardiovascular: Negative for chest pain, palpitations and leg swelling.  Gastrointestinal: Negative for abdominal pain, diarrhea, nausea and vomiting.  Genitourinary: Negative for dysuria, frequency and urgency.  Musculoskeletal: Negative for arthralgias and myalgias.  Skin: Negative for color change, pallor and rash.  Neurological: Negative for syncope, light-headedness and headaches.  Psychiatric/Behavioral: The patient is not nervous/anxious.       Objective:   Physical Exam Vitals signs and nursing note reviewed.   Constitutional:      General: He is not in acute distress.    Appearance: He is obese. He is not toxic-appearing or diaphoretic.  HENT:     Head: Normocephalic and atraumatic.  Cardiovascular:     Rate and Rhythm: Normal rate.     Heart sounds: Normal heart sounds.  Pulmonary:     Effort: Pulmonary effort is normal. No respiratory distress.     Breath sounds: Normal breath sounds.  Musculoskeletal:     Right lower leg: No edema.     Left lower leg: No edema.  Skin:    General: Skin is warm and dry.     Coloration: Skin is not jaundiced or pale.  Neurological:     General: No focal deficit present.     Mental Status: He is alert and oriented to person, place, and time.  Psychiatric:        Mood and Affect: Mood normal.        Behavior: Behavior normal.    BP Readings from Last 3 Encounters:  08/20/18 (!) 148/102  08/06/18 (!) 170/112  07/21/18 (!) 185/108   Wt Readings from Last 3 Encounters:  08/20/18 (!) 515 lb 12.8 oz (234 kg)  08/06/18 (!) 510 lb 9.6 oz (231.6 kg)  07/21/18 (!) 505 lb (229.1 kg)   Today's Vitals   09/17/18 1056  BP: (!) 132/96  Pulse: 90  Resp: 20  Temp: 99.1 F (37.3 C)  TempSrc: Oral  SpO2: 96%  Weight: (!) 484 lb 12.8 oz (219.9 kg)  Height: 5\' 10"  (1.778 m)   Body  mass index is 69.56 kg/m.     Assessment & Plan:    Essential hypertension  -  patient blood pressure is much improved from compared to readings from first few visits.  I think you are on right track with medications as well as weight loss.  He will continue Toprol, amlodipine and hydrochlorothiazide.  Weight loss counseling/morbid obesity - patient is down to 484 pounds, was at 515 pounds.  Patient encouraged to continue working on healthy diet choices and increasing his physical activity.  Advised patient that finding a nutritious diet he can follow for long-term is the main goal so we can have long-term weight loss and weight maintenance as well as overall health.   Patient will follow-up in 2 months for recheck on weight and blood pressure.  He is aware he can return to clinic sooner if any issues arise.

## 2018-11-13 ENCOUNTER — Other Ambulatory Visit: Payer: Self-pay | Admitting: Family Medicine

## 2018-11-13 DIAGNOSIS — I10 Essential (primary) hypertension: Secondary | ICD-10-CM

## 2018-11-19 ENCOUNTER — Other Ambulatory Visit: Payer: Self-pay

## 2018-11-19 ENCOUNTER — Ambulatory Visit (INDEPENDENT_AMBULATORY_CARE_PROVIDER_SITE_OTHER): Payer: BC Managed Care – PPO | Admitting: Family Medicine

## 2018-11-19 VITALS — BP 146/98 | HR 103 | Temp 96.3°F | Resp 20 | Ht 70.0 in | Wt >= 6400 oz

## 2018-11-19 DIAGNOSIS — D509 Iron deficiency anemia, unspecified: Secondary | ICD-10-CM | POA: Diagnosis not present

## 2018-11-19 DIAGNOSIS — Z713 Dietary counseling and surveillance: Secondary | ICD-10-CM | POA: Diagnosis not present

## 2018-11-19 DIAGNOSIS — I10 Essential (primary) hypertension: Secondary | ICD-10-CM | POA: Diagnosis not present

## 2018-11-19 LAB — COMPREHENSIVE METABOLIC PANEL
ALT: 12 U/L (ref 0–53)
AST: 12 U/L (ref 0–37)
Albumin: 3.7 g/dL (ref 3.5–5.2)
Alkaline Phosphatase: 77 U/L (ref 39–117)
BUN: 16 mg/dL (ref 6–23)
CO2: 30 mEq/L (ref 19–32)
Calcium: 9.1 mg/dL (ref 8.4–10.5)
Chloride: 101 mEq/L (ref 96–112)
Creatinine, Ser: 1.45 mg/dL (ref 0.40–1.50)
GFR: 68.89 mL/min (ref >60.00)
Glucose, Bld: 119 mg/dL — ABNORMAL HIGH (ref 70–99)
Potassium: 3.6 mEq/L (ref 3.5–5.1)
Sodium: 140 mEq/L (ref 135–145)
Total Bilirubin: 0.4 mg/dL (ref 0.2–1.2)
Total Protein: 7.4 g/dL (ref 6.0–8.3)

## 2018-11-19 LAB — IBC + FERRITIN
Ferritin: 86.9 ng/mL (ref 22.0–322.0)
Iron: 69 ug/dL (ref 42–165)
Saturation Ratios: 25.1 % (ref 20.0–50.0)
Transferrin: 196 mg/dL — ABNORMAL LOW (ref 212.0–360.0)

## 2018-11-19 LAB — LIPID PANEL
Cholesterol: 152 mg/dL (ref 0–200)
HDL: 35.6 mg/dL — ABNORMAL LOW (ref >39.00)
LDL Cholesterol: 102 mg/dL — ABNORMAL HIGH (ref 0–99)
NonHDL: 116.43
Total CHOL/HDL Ratio: 4
Triglycerides: 71 mg/dL (ref 0.0–149.0)
VLDL: 14.2 mg/dL (ref 0.0–40.0)

## 2018-11-19 LAB — CBC
HCT: 32.6 % — ABNORMAL LOW (ref 39.0–52.0)
Hemoglobin: 10.2 g/dL — ABNORMAL LOW (ref 13.0–17.0)
MCHC: 31.2 g/dL (ref 30.0–36.0)
MCV: 76.1 fl — ABNORMAL LOW (ref 78.0–100.0)
Platelets: 263 10*3/uL (ref 150.0–400.0)
RBC: 4.29 Mil/uL (ref 4.22–5.81)
RDW: 17.5 % — ABNORMAL HIGH (ref 11.5–15.5)
WBC: 10.7 10*3/uL — ABNORMAL HIGH (ref 4.0–10.5)

## 2018-11-19 LAB — B12 AND FOLATE PANEL
Folate: 6.1 ng/mL (ref >5.9)
Vitamin B-12: 181 pg/mL — ABNORMAL LOW (ref 211–911)

## 2018-11-19 LAB — HEMOGLOBIN A1C: Hgb A1c MFr Bld: 7.1 % — ABNORMAL HIGH (ref 4.6–6.5)

## 2018-11-19 MED ORDER — AMLODIPINE BESYLATE 10 MG PO TABS: ORAL_TABLET | ORAL | 2 refills | Status: DC

## 2018-11-19 MED ORDER — ENALAPRIL-HYDROCHLOROTHIAZIDE 10-25 MG PO TABS: 1.0000 | ORAL_TABLET | Freq: Every day | ORAL | 2 refills | Status: DC

## 2018-11-19 MED ORDER — METOPROLOL SUCCINATE ER 50 MG PO TB24: ORAL_TABLET | ORAL | 2 refills | Status: DC

## 2018-11-19 NOTE — Progress Notes (Signed)
Subjective:    Patient ID: Darrell Campbell, male    DOB: [birth year], 28 y.o.   MRN: [id number]  HPI   Patient presents to clinic for follow-up on blood pressure.  Also presents for follow-up on weight loss.  BP continues to remain above goal even with hydrochlorothiazide, amlodipine and metoprolol.  Patient denies any adverse effects with this medicine for increased lower extremity swelling  Patient was doing well with following a diet.  States last month was very difficult for him, so stopped calories and being more mindful of what he was eating.  Plans to get back on track this month.  He has been trying to get out and walk more, but is very hot so did not do this much.  Patient Active Problem List   Diagnosis Date Noted  . Iron deficiency anemia 11/19/2018  . Essential hypertension 07/30/2018  . Morbid obesity ([place]) 07/30/2018   Social History   Tobacco Use  . Smoking status: Never Smoker  . Smokeless tobacco: Never Used  Substance Use Topics  . Alcohol use: Yes    Comment: socially   Review of Systems  Constitutional: Negative for chills, fatigue and fever.  HENT: Negative for congestion, ear pain, sinus pain and sore throat.   Eyes: Negative.   Respiratory: Negative for cough, shortness of breath and wheezing.   Cardiovascular: Negative for chest pain, palpitations and leg swelling.  Gastrointestinal: Negative for abdominal pain, diarrhea, nausea and vomiting.  Genitourinary: Negative for dysuria, frequency and urgency.  Musculoskeletal: Negative for arthralgias and myalgias.  Skin: Negative for color change, pallor and rash.  Neurological: Negative for syncope, light-headedness and headaches.  Psychiatric/Behavioral: The patient is not nervous/anxious.       Objective:   Physical Exam Vitals signs and nursing note reviewed.  Constitutional:      General: He is not in acute distress.    Appearance: He is obese. He is not ill-appearing, toxic-appearing or  diaphoretic.  Eyes:     General: No scleral icterus.    Extraocular Movements: Extraocular movements intact.     Conjunctiva/sclera: Conjunctivae normal.     Pupils: Pupils are equal, round, and reactive to light.  Neck:     Musculoskeletal: Normal range of motion. No neck rigidity.  Cardiovascular:     Rate and Rhythm: Regular rhythm.     Heart sounds: Normal heart sounds.  Pulmonary:     Effort: Pulmonary effort is normal. No respiratory distress.     Breath sounds: Normal breath sounds.  Skin:    General: Skin is warm and dry.     Coloration: Skin is not jaundiced or pale.  Neurological:     Mental Status: He is alert and oriented to person, place, and time.  Psychiatric:        Mood and Affect: Mood normal.        Behavior: Behavior normal.    Today's Vitals   11/19/18 1050 11/19/18 1105  BP: (!) 148/102 (!) 146/98  Pulse: (!) 103   Resp: 20   Temp: (!) 96.3 F (35.7 C)   TempSrc: Temporal   SpO2: 91%   Weight: (!) 524 lb 6.4 oz (237.9 kg)   Height: 5\' 10"  (1.778 m)    Body mass index is 75.24 kg/m.  Wt Readings from Last 3 Encounters:  11/19/18 (!) 524 lb 6.4 oz (237.9 kg)  09/17/18 (!) 484 lb 12.8 oz (219.9 kg)  08/20/18 (!) 515 lb 12.8 oz (234 kg)  BP Readings from Last 3 Encounters:  11/19/18 (!) 146/98  09/17/18 (!) 132/96  08/20/18 (!) 148/102       Assessment & Plan:    Essential hypertension --we will add enalapril on top of her blood pressure medication regimen for better control.  We will also metabolic panel in clinic today   Weight loss counseling/morbid obesity --Long discussion with patient regards to and exercise.  Encourage patient to count calories, avoid excess sugars and carbohydrates.  Recommend he try to eat foods that are lean proteins, lots of vegetables keep up good water intake and avoid things that are processed.  Encouraged to do walking at least 30 minutes 5 days a week and also lifting some small weights and slowly build up  stamina her maturity and does not play.  The best weight loss plans are months that are sustainable for the long-term; this is why eating nutritious foods and eating in moderation are the keys to long term success - rather than following a very restrictive diet that is not sustainable.   Iron deficiency anemia-patient has a history of this, follow-up with new blood work including B12 level.  Lab work collected in clinic today  Flu shot declined  Patient will follow-up in clinic in about 4 to 6 weeks for continued monitoring of blood pressure and weight loss progress.

## 2018-12-04 ENCOUNTER — Encounter: Payer: Self-pay | Admitting: Lab

## 2018-12-31 ENCOUNTER — Ambulatory Visit (INDEPENDENT_AMBULATORY_CARE_PROVIDER_SITE_OTHER): Payer: BC Managed Care – PPO | Admitting: Family Medicine

## 2018-12-31 ENCOUNTER — Encounter: Payer: Self-pay | Admitting: Family Medicine

## 2018-12-31 ENCOUNTER — Other Ambulatory Visit: Payer: Self-pay

## 2018-12-31 DIAGNOSIS — Z713 Dietary counseling and surveillance: Secondary | ICD-10-CM

## 2018-12-31 DIAGNOSIS — I10 Essential (primary) hypertension: Secondary | ICD-10-CM

## 2018-12-31 MED ORDER — METOPROLOL SUCCINATE ER 50 MG PO TB24: ORAL_TABLET | ORAL | 2 refills | Status: DC

## 2018-12-31 MED ORDER — ENALAPRIL-HYDROCHLOROTHIAZIDE 10-25 MG PO TABS: 1.0000 | ORAL_TABLET | Freq: Every day | ORAL | 2 refills | Status: DC

## 2018-12-31 MED ORDER — AMLODIPINE BESYLATE 10 MG PO TABS: ORAL_TABLET | ORAL | 2 refills | Status: DC

## 2018-12-31 NOTE — Progress Notes (Signed)
Patient ID: Darrell Campbell, male   DOB: 1990/06/02, 28 y.o.   MRN: [id number]    Virtual Visit via video Note  This visit type was conducted due to national recommendations for restrictions regarding the COVID-19 pandemic (e.g. social distancing).  This format is felt to be most appropriate for this patient at this time.  All issues noted in this document were discussed and addressed.  No physical exam was performed (except for noted visual exam findings with Video Visits).   I connected with Darrell Campbell today at 10:40 AM EDT by a video enabled telemedicine application or telephone and verified that I am speaking with the correct person using two identifiers. Location patient: home Location provider: work or home office Persons participating in the virtual visit: patient, provider  I discussed the limitations, risks, security and privacy concerns of performing an evaluation and management service by video and the availability of in person appointments. I also discussed with the patient that there may be a patient responsible charge related to this service. The patient expressed understanding and agreed to proceed.   HPI:  Patient and I connected via video to follow-up on blood pressure.  Currently he is on amlodipine, metoprolol, enalapril and hydrochlorothiazide.  So far states tolerating these medicines not any issue.  Not sure if he has seen much of a difference with addition of medicine, but also states he has not checked his blood pressure in the past few weeks.  States he is trying to be more active, has started to go out and walk his dog more frequently.  He is also been trying to eat healthier, but states he does have days where he falls off the wagon and finds himself eating large amounts of unhealthy items.  ROS: See pertinent positives and negatives per HPI.  Past Medical History:  Diagnosis Date  . Gout   . Hypertension     Family History  Problem Relation Age of Onset   . Bronchitis Mother   . Hypertension Father    Social History   Tobacco Use  . Smoking status: Never Smoker  . Smokeless tobacco: Never Used  Substance Use Topics  . Alcohol use: Yes    Comment: socially    Current Outpatient Medications:  .  amLODipine (NORVASC) 10 MG tablet, TAKE 1 TABLET(10 MG) BY MOUTH DAILY, Disp: 30 tablet, Rfl: 2 .  colchicine 0.6 MG tablet, Take 1 tablet (0.6 mg total) by mouth daily., Disp: 30 tablet, Rfl: 2 .  enalapril-hydrochlorothiazide (VASERETIC) 10-25 MG tablet, Take 1 tablet by mouth daily., Disp: 30 tablet, Rfl: 2 .  ferrous sulfate 325 (65 FE) MG tablet, Take 1 tablet (325 mg total) by mouth daily with breakfast., Disp: 90 tablet, Rfl: 1 .  folic acid (FOLVITE) 400 MCG tablet, Take 1 tablet (400 mcg total) by mouth daily., Disp: 90 tablet, Rfl: 1 .  metoprolol succinate (TOPROL-XL) 50 MG 24 hr tablet, TAKE 1 TABLET BY MOUTH EVERY DAY WITH OR IMMEDIATELY FOLLOWING A MEAL, Disp: 30 tablet, Rfl: 2 .  Vitamin D, Ergocalciferol, (DRISDOL) 1.25 MG (50000 UT) CAPS capsule, Take 1 capsule (50,000 Units total) by mouth every 7 (seven) days., Disp: 12 capsule, Rfl: 1  EXAM:  GENERAL: alert, oriented, appears well and in no acute distress  HEENT: atraumatic, conjunttiva clear, no obvious abnormalities on inspection of external nose and ears  NECK: normal movements of the head and neck  LUNGS: on inspection no signs of respiratory distress, breathing rate appears normal,  no obvious gross SOB, gasping or wheezing  CV: no obvious cyanosis  MS: moves all visible extremities without noticeable abnormality  PSYCH/NEURO: pleasant and cooperative, no obvious depression or anxiety, speech and thought processing grossly intact  ASSESSMENT AND PLAN:  Discussed the following assessment and plan:  Essential hypertension-tolerating medicines without any adverse side effects.  We will send in refills to be sure he has proper supply.  Encourage patient to check  blood pressure at least a few times a week to keep a log of readings.  Advised when he checks pressure he can send MyChart message with log of his readings.  Advised patient that if we are still not at his blood pressure goal in the next few weeks, most likely will have to refer to cardiology due to difficult to control blood pressure.  Morbid obesity/weight loss counseling-more than half of the 25-minute visit spent counseling patient on weight loss.  Encouraged regular physical activity like walking or lifting small weights and monitoring portion sizes, calorie counting and increasing water intake.  Advised patient there is no specific diet that is superior, he really is about eating in moderation and creating a calorie deficit to allow for weight loss.  Advised patient that fast weight loss often is not sustainable weight loss, so it is about consistency in keeping self on a good healthy eating plan that includes balanced meals.   I discussed the assessment and treatment plan with the patient. The patient was provided an opportunity to ask questions and all were answered. The patient agreed with the plan and demonstrated an understanding of the instructions.   The patient was advised to call back or seek an in-person evaluation if the symptoms worsen or if the condition fails to improve as anticipated.   Jodelle Green, FNP

## 2019-01-28 DIAGNOSIS — H5213 Myopia, bilateral: Secondary | ICD-10-CM | POA: Diagnosis not present

## 2019-01-28 DIAGNOSIS — H18623 Keratoconus, unstable, bilateral: Secondary | ICD-10-CM | POA: Diagnosis not present

## 2019-08-05 ENCOUNTER — Ambulatory Visit: Payer: Self-pay

## 2019-08-07 ENCOUNTER — Other Ambulatory Visit: Payer: Self-pay

## 2019-08-07 ENCOUNTER — Ambulatory Visit: Payer: Self-pay | Admitting: Physician Assistant

## 2019-08-07 DIAGNOSIS — Z113 Encounter for screening for infections with a predominantly sexual mode of transmission: Secondary | ICD-10-CM

## 2019-08-08 ENCOUNTER — Encounter: Payer: Self-pay | Admitting: Physician Assistant

## 2019-08-08 NOTE — Progress Notes (Signed)
   [facility] Department STI clinic/screening visit  Subjective:  Darrell Campbell is a 29 y.o. male being seen today for an STI screening visit. The patient reports they do not have symptoms.    Patient has the following medical conditions:   Patient Active Problem List   Diagnosis Date Noted  . Iron deficiency anemia 11/19/2018  . Essential hypertension 07/30/2018  . Morbid obesity (HCC) 07/30/2018     Chief Complaint  Patient presents with  . SEXUALLY TRANSMITTED DISEASE    screening    HPI  Patient reports that his recent ex partner and DIS contacted him and told him that he needed to have an HIV test done.  Patient denies any symptoms and states that the last sexual contact that he has had was over 1 year ago and that it was oral sex.  Last "regular" sex was years ago.  Has a history of gout, HTN and is also "pre-diabetic".  Takes BP med, gout med, iron pills and folic acid.  Declines screening exam and states that he only wants blood work today.   See flowsheet for further details and programmatic requirements.    The following portions of the patient's history were reviewed and updated as appropriate: allergies, current medications, past medical history, past social history, past surgical history and problem list.  Objective:  There were no vitals filed for this visit.  Physical Exam Constitutional:      General: He is not in acute distress.    Appearance: Normal appearance. He is obese.  HENT:     Head: Normocephalic and atraumatic.  Eyes:     Conjunctiva/sclera: Conjunctivae normal.  Pulmonary:     Effort: Pulmonary effort is normal.  Neurological:     Mental Status: He is alert and oriented to person, place, and time.  Psychiatric:        Mood and Affect: Mood normal.        Behavior: Behavior normal.        Thought Content: Thought content normal.        Judgment: Judgment normal.       Assessment and Plan:  Darrell Sur A Kant is a 29  y.o. male presenting to the [facility] Department for STI screening  1. Screening for STD (sexually transmitted disease) Patient into clinic without symptoms. Counseled that he should continue to avoid sexual contact until results are back.  Counseled that RN will call if needs to RTC and that someone with the state Health Department would also contact him for an extended interview if needed. Rec condoms with all sex. - HIV/HCV [facility], [facility]     No follow-ups on file.  No future appointments.  Matt Holmes, PA

## 2019-08-14 LAB — HM HEPATITIS C SCREENING LAB: HM Hepatitis Screen: NEGATIVE

## 2019-08-14 LAB — HM HIV SCREENING LAB: HM HIV Screening: NEGATIVE

## 2019-08-15 ENCOUNTER — Other Ambulatory Visit: Payer: Self-pay | Admitting: Family Medicine

## 2019-08-15 ENCOUNTER — Encounter: Payer: Self-pay | Admitting: Family Medicine

## 2020-09-30 ENCOUNTER — Encounter: Payer: Self-pay | Admitting: Internal Medicine

## 2020-09-30 ENCOUNTER — Ambulatory Visit (INDEPENDENT_AMBULATORY_CARE_PROVIDER_SITE_OTHER): Payer: Self-pay | Admitting: Internal Medicine

## 2020-09-30 ENCOUNTER — Other Ambulatory Visit: Payer: Self-pay

## 2020-09-30 VITALS — BP 146/98 | HR 98 | Temp 97.8°F | Ht 71.0 in | Wt >= 6400 oz

## 2020-09-30 DIAGNOSIS — E559 Vitamin D deficiency, unspecified: Secondary | ICD-10-CM

## 2020-09-30 DIAGNOSIS — Z1389 Encounter for screening for other disorder: Secondary | ICD-10-CM

## 2020-09-30 DIAGNOSIS — I10 Essential (primary) hypertension: Secondary | ICD-10-CM

## 2020-09-30 DIAGNOSIS — Z13818 Encounter for screening for other digestive system disorders: Secondary | ICD-10-CM

## 2020-09-30 DIAGNOSIS — Z113 Encounter for screening for infections with a predominantly sexual mode of transmission: Secondary | ICD-10-CM

## 2020-09-30 DIAGNOSIS — I890 Lymphedema, not elsewhere classified: Secondary | ICD-10-CM | POA: Insufficient documentation

## 2020-09-30 DIAGNOSIS — I872 Venous insufficiency (chronic) (peripheral): Secondary | ICD-10-CM

## 2020-09-30 DIAGNOSIS — R7303 Prediabetes: Secondary | ICD-10-CM | POA: Insufficient documentation

## 2020-09-30 DIAGNOSIS — R600 Localized edema: Secondary | ICD-10-CM

## 2020-09-30 DIAGNOSIS — Z1329 Encounter for screening for other suspected endocrine disorder: Secondary | ICD-10-CM

## 2020-09-30 DIAGNOSIS — G4733 Obstructive sleep apnea (adult) (pediatric): Secondary | ICD-10-CM | POA: Insufficient documentation

## 2020-09-30 DIAGNOSIS — R739 Hyperglycemia, unspecified: Secondary | ICD-10-CM

## 2020-09-30 LAB — CBC WITH DIFFERENTIAL/PLATELET
Basophils Absolute: 0 10*3/uL (ref 0.0–0.1)
Basophils Relative: 0.2 % (ref 0.0–3.0)
Eosinophils Absolute: 0.3 10*3/uL (ref 0.0–0.7)
Eosinophils Relative: 2.8 % (ref 0.0–5.0)
HCT: 33.3 % — ABNORMAL LOW (ref 39.0–52.0)
Hemoglobin: 10.4 g/dL — ABNORMAL LOW (ref 13.0–17.0)
Lymphocytes Relative: 24.6 % (ref 12.0–46.0)
Lymphs Abs: 2.3 10*3/uL (ref 0.7–4.0)
MCHC: 31.4 g/dL (ref 30.0–36.0)
MCV: 75.7 fl — ABNORMAL LOW (ref 78.0–100.0)
Monocytes Absolute: 0.8 10*3/uL (ref 0.1–1.0)
Monocytes Relative: 8.5 % (ref 3.0–12.0)
Neutro Abs: 6.1 10*3/uL (ref 1.4–7.7)
Neutrophils Relative %: 63.9 % (ref 43.0–77.0)
Platelets: 264 10*3/uL (ref 150.0–400.0)
RBC: 4.4 Mil/uL (ref 4.22–5.81)
RDW: 18.1 % — ABNORMAL HIGH (ref 11.5–15.5)
WBC: 9.5 10*3/uL (ref 4.0–10.5)

## 2020-09-30 LAB — COMPREHENSIVE METABOLIC PANEL
ALT: 19 U/L (ref 0–53)
AST: 16 U/L (ref 0–37)
Albumin: 3.9 g/dL (ref 3.5–5.2)
Alkaline Phosphatase: 106 U/L (ref 39–117)
BUN: 21 mg/dL (ref 6–23)
CO2: 30 mEq/L (ref 19–32)
Calcium: 9.6 mg/dL (ref 8.4–10.5)
Chloride: 100 mEq/L (ref 96–112)
Creatinine, Ser: 1.7 mg/dL — ABNORMAL HIGH (ref 0.40–1.50)
GFR: 52.72 mL/min — ABNORMAL LOW (ref >60.00)
Glucose, Bld: 92 mg/dL (ref 70–99)
Potassium: 3.6 mEq/L (ref 3.5–5.1)
Sodium: 139 mEq/L (ref 135–145)
Total Bilirubin: 0.5 mg/dL (ref 0.2–1.2)
Total Protein: 7.5 g/dL (ref 6.0–8.3)

## 2020-09-30 LAB — LIPID PANEL
Cholesterol: 178 mg/dL (ref 0–200)
HDL: 40.8 mg/dL (ref >39.00)
LDL Cholesterol: 121 mg/dL — ABNORMAL HIGH (ref 0–99)
NonHDL: 137.41
Total CHOL/HDL Ratio: 4
Triglycerides: 80 mg/dL (ref 0.0–149.0)
VLDL: 16 mg/dL (ref 0.0–40.0)

## 2020-09-30 LAB — D-DIMER, QUANTITATIVE: D-Dimer, Quant: 0.45 mcg/mL FEU (ref <0.50)

## 2020-09-30 LAB — BRAIN NATRIURETIC PEPTIDE: Pro B Natriuretic peptide (BNP): 65 pg/mL (ref 0.0–100.0)

## 2020-09-30 LAB — HEMOGLOBIN A1C: Hgb A1c MFr Bld: 5.8 % (ref 4.6–6.5)

## 2020-09-30 LAB — TSH: TSH: 2.38 u[IU]/mL (ref 0.35–5.50)

## 2020-09-30 LAB — VITAMIN D 25 HYDROXY (VIT D DEFICIENCY, FRACTURES): VITD: 8.26 ng/mL — ABNORMAL LOW (ref 30.00–100.00)

## 2020-09-30 MED ORDER — LOSARTAN POTASSIUM-HCTZ 50-12.5 MG PO TABS: 1.0000 | ORAL_TABLET | Freq: Every day | ORAL | 3 refills | Status: DC

## 2020-09-30 NOTE — Progress Notes (Signed)
Chief Complaint  Patient presents with   Establish Care   New patient  1. Htn not on meds x 2 months ? Med on h/o prediabetes and morbid obesity wt HS was 280 wt had been down to 370-390  2. Morbid obesity  3. [place]sa needs sleep study 4. Lost job in [place] 1/202 after getting covid and mood depressed until 08/2020 when moved with mom now mood better     Review of Systems  Constitutional:  Negative for weight loss.  HENT:  Negative for hearing loss.   Eyes:  Negative for blurred vision.  Respiratory:  Negative for shortness of breath.   Cardiovascular:  Positive for leg swelling. Negative for chest pain.       Chronic leg swelling x 2 years  Gastrointestinal:  Negative for abdominal pain.  Musculoskeletal:  Negative for falls and joint pain.  Skin:  Negative for rash.  Neurological:  Negative for headaches.  Psychiatric/Behavioral:  Negative for depression.   Past Medical History:  Diagnosis Date   COVID-19    03/2020   HTN (hypertension)    Morbid obesity (HCC)    Prediabetes    Past Surgical History:  Procedure Laterality Date   NO PAST SURGERIES     Family History  Problem Relation Age of Onset   Hypertension Mother    Alcohol abuse Father    Hypertension Sister    Hypertension Maternal Grandmother    Diabetes Maternal Grandmother    Cancer Maternal Grandmother        breast   Arthritis Maternal Grandmother    Alcohol abuse Maternal Grandfather    Social History   Socioeconomic History   Marital status: Single    Spouse name: Not on file   Number of children: Not on file   Years of education: Not on file   Highest education level: Not on file  Occupational History   Not on file  Tobacco Use   Smoking status: Never   Smokeless tobacco: Never  Substance and Sexual Activity   Alcohol use: Yes    Comment: Occassionally   Drug use: Never   Sexual activity: Yes  Other Topics Concern   Not on file  Social History Narrative   BA    IT support    Single     [employer]/online music prod. Degree 04/2016    Social Determinants of Health   Financial Resource Strain: Not on file  Food Insecurity: Not on file  Transportation Needs: Not on file  Physical Activity: Not on file  Stress: Not on file  Social Connections: Not on file  Intimate Partner Violence: Not on file   Current Meds  Medication Sig   losartan-hydrochlorothiazide (HYZAAR) 50-12.5 MG tablet Take 1 tablet by mouth daily.   Allergies  Allergen Reactions   Yellow Jacket Venom Swelling   No results found for this or any previous visit (from the past 2160 hour(s)). Objective  Body mass index is 68.62 kg/m. Wt Readings from Last 3 Encounters:  09/30/20 (!) 492 lb (223.2 kg)   Temp Readings from Last 3 Encounters:  09/30/20 97.8 F (36.6 C) (Temporal)   BP Readings from Last 3 Encounters:  09/30/20 (!) 146/98   Pulse Readings from Last 3 Encounters:  09/30/20 98    Physical Exam Vitals and nursing note reviewed.  Constitutional:      Appearance: Normal appearance. He is well-developed and well-groomed. He is morbidly obese.  HENT:     Head: Normocephalic and atraumatic.  Eyes:     Conjunctiva/sclera: Conjunctivae normal.     Pupils: Pupils are equal, round, and reactive to light.  Cardiovascular:     Rate and Rhythm: Normal rate and regular rhythm.     Heart sounds: No murmur heard. Pulmonary:     Effort: Pulmonary effort is normal.     Breath sounds: Normal breath sounds.  Skin:    General: Skin is warm and dry.     Comments: Dry skin to back and stasis derm changes to b/l legs   Neurological:     General: No focal deficit present.     Mental Status: He is alert and oriented to person, place, and time. Mental status is at baseline.     Gait: Gait normal.  Psychiatric:        Attention and Perception: Attention and perception normal.        Mood and Affect: Mood and affect normal.        Speech: Speech normal.        Behavior: Behavior normal. Behavior is  cooperative.        Thought Content: Thought content normal.        Cognition and Memory: Cognition and memory normal.        Judgment: Judgment normal.    Assessment  Plan  Hypertension, unspecified type - Plan: Lipid panel, Comprehensive metabolic panel, CBC with Differential/Platelet, Amb ref to Medical Nutrition Therapy-[facility] Losartan hct 50-12.5 mg qd  Bp check in 1-2 weeks   Morbid obesity (HCC) - Plan: Amb ref to Medical Nutrition Therapy-[facility] Hyperglycemia - Plan: Hemoglobin A1c Prediabetes  Rec healthy diet and exercise   Vitamin D deficiency - Plan: Vitamin D (25 hydroxy)  Lymphedema with stasis derm changes and dry skin back Consider compression stockings and vascular and derm  Leg edema - Plan: D-Dimer, Quantitative, Brain natriuretic peptide  Prediabetes - Plan: Amb ref to Medical Nutrition Therapy-[facility]  OSA (obstructive sleep apnea) - Plan: Ambulatory referral to Pulmonology  HM Rec flu, tdap Pfizer 2/2 had covid  Declines hiv and hep C std check   Provider: Dr. French Ana McLean-Scocuz[facility]-Internal Medicine

## 2020-09-30 NOTE — Patient Instructions (Addendum)
Tdap vaccine rec.  Flu 11/2020  Think about 3rd covid 19 vaccine   Sleep Apnea Sleep apnea affects breathing during sleep. It causes breathing to stop for 10 seconds or more, or to become shallow. People with sleep apnea usually snoreloudly. It can also increase the risk of: Heart attack. Stroke. Being very overweight (obese). Diabetes. Heart failure. Irregular heartbeat. High blood pressure. The goal of treatment is to help you breathe normally again. What are the causes?  The most common cause of this condition is a collapsed or blocked airway. There are three kinds of sleep apnea: Obstructive sleep apnea. This is caused by a blocked or collapsed airway. Central sleep apnea. This happens when the brain does not send the right signals to the muscles that control breathing. Mixed sleep apnea. This is a combination of obstructive and central sleep apnea. What increases the risk? Being overweight. Smoking. Having a small airway. Being older. Being male. Drinking alcohol. Taking medicines to calm yourself (sedatives or tranquilizers). Having family members with the condition. Having a tongue or tonsils that are larger than normal. What are the signs or symptoms? Trouble staying asleep. Loud snoring. Headaches in the morning. Waking up gasping. Dry mouth or sore throat in the morning. Being sleepy or tired during the day. If you are sleepy or tired during the day, you may also: Not be able to focus your mind (concentrate). Forget things. Get angry a lot and have mood swings. Feel sad (depressed). Have changes in your personality. Have less interest in sex, if you are male. Be unable to have an erection, if you are male. How is this treated?  Sleeping on your side. Using a medicine to get rid of mucus in your nose (decongestant). Avoiding the use of alcohol, medicines to help you relax, or certain pain medicines (narcotics). Losing weight, if needed. Changing your  diet. Quitting smoking. Using a machine to open your airway while you sleep, such as: An oral appliance. This is a mouthpiece that shifts your lower jaw forward. A CPAP device. This device blows air through a mask when you breathe out (exhale). An EPAP device. This has valves that you put in each nostril. A BPAP device. This device blows air through a mask when you breathe in (inhale) and breathe out. Having surgery if other treatments do not work. Follow these instructions at home: Lifestyle Make changes that your doctor recommends. Eat a healthy diet. Lose weight if needed. Avoid alcohol, medicines to help you relax, and some pain medicines. Do not smoke or use any products that contain nicotine or tobacco. If you need help quitting, ask your doctor. General instructions Take over-the-counter and prescription medicines only as told by your doctor. If you were given a machine to use while you sleep, use it only as told by your doctor. If you are having surgery, make sure to tell your doctor you have sleep apnea. You may need to bring your device with you. Keep all follow-up visits. Contact a doctor if: The machine that you were given to use during sleep bothers you or does not seem to be working. You do not get better. You get worse. Get help right away if: Your chest hurts. You have trouble breathing in enough air. You have an uncomfortable feeling in your back, arms, or stomach. You have trouble talking. One side of your body feels weak. A part of your face is hanging down. These symptoms may be an emergency. Get help right away. Call your  local emergency services (911 in the [place] Do not wait to see if the symptoms will go away. Do not drive yourself to the hospital. Summary This condition affects breathing during sleep. The most common cause is a collapsed or blocked airway. The goal of treatment is to help you breathe normally while you sleep. This information is not  intended to replace advice given to you by your health care provider. Make sure you discuss any questions you have with your healthcare provider. Document Revised: 02/06/2020 Document Reviewed: 02/06/2020 Elsevier Patient Education  2022 [employer]  Sleep Study, Adult A sleep study (polysomnogram) is a series of tests done while you are sleeping. A sleep study records your brain waves, heart rate, breathing rate, oxygen level, and eye and legmovements. A sleep study helps your health care provider: See how well you sleep. Diagnose a sleep disorder. Determine how severe your sleep disorder is. Create a plan to treat your sleep disorder. Your health care provider may recommend a sleep study if you: Feel sleepy on most days. Snore loudly while sleeping. Have unusual behaviors while you sleep, such as walking. Have brief periods in which you stop breathing during sleep (sleepapnea). Fall asleep suddenly during the day (narcolepsy). Have trouble falling asleep or staying asleep (insomnia). Feel like you need to move your legs when trying to fall asleep (restless legs syndrome). Move your legs by flexing and extending them regularly while asleep (periodic limb movement disorder). Act out your dreams while you sleep (sleep behavior disorder). Feel like you cannot move when you first wake up (sleep paralysis). What tests are part of a sleep study? Most sleep studies record the following during sleep: Brain activity. Eye movements. Heart rate and rhythm. Breathing rate and rhythm. Blood-oxygen level. Blood pressure. Chest and belly movement as you breathe. Arm and leg movements. Snoring or other noises. Body position. Where are sleep studies done? Sleep studies are done at sleep centers. A sleep [facility] may be inside [facility], office, or clinic. The room where you have the study may look like a hospital room or a hotel room. The health care providers doing the study may come in and out  of the room during the study. Most of the time, they will be in another room monitoringyour test as you sleep. How are sleep studies done? Most sleep studies are done during a normal period of time for a full night of sleep. You will arrive at the study center in the evening and go home in themorning. Before the test Bring your pajamas and toothbrush with you to the sleep study. Do not have caffeine on the day of your sleep study. Do not drink alcohol on the day of your sleep study. Your health care provider will let you know if you should stop taking any of your regular medicines before the test. During the test     Round, sticky patches with sensors attached to recording wires (electrodes) are placed on your scalp, face, chest, and limbs. Wires from all the electrodes and sensors run from your bed to a computer. The wires can be taken off and put back on if you need to get out of bed to go to the bathroom. A sensor is placed over your nose to measure airflow. A finger clip is put on your finger or ear to measure your blood oxygen level (pulse oximetry). A belt is placed around your belly and a belt is placed around your chest to measure breathing movements. If you  have signs of the sleep disorder called sleep apnea during your test, you may get a treatment mask to wear for the second half of the night. The mask provides positive airway pressure (PAP) to help you breathe better during sleep. This may greatly improve your sleep apnea. You will then have all tests done again with the mask in place to see if your measurements and recordings change. After the test A medical doctor who specializes in sleep will evaluate the results of your sleep study and share them with you and your primary health care provider. Based on your results, your medical history, and a physical exam, you may be diagnosed with a sleep disorder, such as: Sleep apnea. Restless legs syndrome. Sleep-related behavior  disorder. Sleep-related movement disorders. Sleep-related seizure disorders. Your health care team will help determine your treatment options based on your diagnosis. This may include: Improving your sleep habits (sleep hygiene). Wearing a continuous positive airway pressure (CPAP) or bi-level positive airway pressure (BPAP) mask. Wearing an oral device at night to improve breathing and reduce snoring. Taking medicines. Follow these instructions at home: Take over-the-counter and prescription medicines only as told by your health care provider. If you are instructed to use a CPAP or BPAP mask, make sure you use it nightly as directed. Make any lifestyle changes that your health care provider recommends. If you were given a device to open your airway while you sleep, use it only as told by your health care provider. Do not use any tobacco products, such as cigarettes, chewing tobacco, and e-cigarettes. If you need help quitting, ask your health care provider. Keep all follow-up visits as told by your health care provider. This is important. Summary A sleep study (polysomnogram) is a series of tests done while you are sleeping. It shows how well you sleep. Most sleep studies are done over one full night of sleep. You will arrive at the study center in the evening and go home in the morning. If you have signs of the sleep disorder called sleep apnea during your test, you may get a treatment mask to wear for the second half of the night. A medical doctor who specializes in sleep will evaluate the results of your sleep study and share them with your primary health care provider. This information is not intended to replace advice given to you by your health care provider. Make sure you discuss any questions you have with your healthcare provider. Document Revised: 04/04/2019 Document Reviewed: 03/27/2017 Elsevier Patient Education  2022 [employer]  Lymphedema  Lymphedema is swelling that is  caused by the abnormal collection of lymph in the tissues under the skin. Lymph is excess fluid from the tissues in your body that is removed through the lymphatic system. This system is part of your body's defense system (immune system) and includes lymph nodes and lymph vessels. The lymph vessels collect and carry the excess fluid, fats, proteins, and waste from the tissues of the body to the bloodstream. This system also works to clean and remove bacteria andwaste products from the body. Lymphedema occurs when the lymphatic system is blocked. When the lymph vessels or lymph nodes are blocked or damaged, lymph does not drain properly. This causes an abnormal buildup of lymph, which leads to swelling in the affected area. This may include the trunk area, or an arm or leg. Lymphedema cannot becured by medicines, but various methods can be used to help reduce the swelling. What are the causes? The cause of  this condition depends on the type of lymphedema that you have. Primary lymphedema is caused by the absence of lymph vessels or having abnormal lymph vessels at birth. Secondary lymphedema occurs when lymph vessels are blocked or damaged. Secondary lymphedema is more common. Common causes of lymph vessel blockage include: Skin infection, such as cellulitis. Infection by parasites (filariasis). Injury. Radiation therapy. Cancer. Formation of scar tissue. Surgery. What are the signs or symptoms? Symptoms of this condition include: Swelling of the arm or leg. A heavy or tight feeling in the arm or leg. Swelling of the feet, toes, or fingers. Shoes or rings may fit more tightly than before. Redness of the skin over the affected area. Limited movement of the affected limb. Sensitivity to touch or discomfort in the affected limb. How is this diagnosed? This condition may be diagnosed based on: Your symptoms and medical history. A physical exam. Bioimpedance spectroscopy. In this test, painless  electrical currents are used to measure fluid levels in your body. Imaging tests, such as: MRI. CT scan. Duplex ultrasound. This test uses sound waves to produce images of the vessels and the blood flow on a screen. Lymphoscintigraphy. In this test, a low dose of a radioactive substance is injected to trace the flow of lymph through your lymph vessels. Lymphangiography. In this test, a contrast dye is injected into the lymph vessel to help show blockages. How is this treated?  If an underlying condition is causing the lymphedema, that condition will betreated. For example, antibiotic medicines may be used to treat an infection. Treatment for this condition will depend on the cause of your lymphedema. Treatment may include: Complete decongestive therapy (CDT). This is done by a certified lymphedema therapist to reduce fluid congestion. This therapy includes: Skin care. Compression wrapping of the affected area. Manual lymph drainage. This is a special massage technique that promotes lymph drainage out of a limb. Specific exercises. Certain exercises can help fluid move out of the affected limb. Compression. Various methods may be used to apply pressure to the affected limb to reduce the swelling. They include: Wearing compression stockings or sleeves on the affected limb. Wrapping the affected limb with special bandages. Surgery. This is usually done for severe cases only. For example, surgery may be done if you have trouble moving the limb or if the swelling does not get better with other treatments. Follow these instructions at home: Self-care The affected area is more likely to become injured or infected. Take these steps to help prevent infection: Keep the affected area clean and dry. Use approved creams or lotions to keep the skin moisturized. Protect your skin from cuts: Use gloves while cooking or gardening. Do not walk barefoot. If you shave the affected area, use an Psychiatrist. Do not wear tight clothes, shoes, or jewelry. Eat a healthy diet that includes a lot of fruits and vegetables. Activity Do exercises as told by your health care provider. Do not sit with your legs crossed. When possible, keep the affected limb raised (elevated) above the level of your heart. Avoid carrying things with an arm that is affected by lymphedema. General instructions Wear compression stockings or sleeves as told by your health care provider. Note any changes in size of the affected limb. You may be instructed to take regular measurements and keep track of them. Take over-the-counter and prescription medicines only as told by your health care provider. If you were prescribed an antibiotic medicine, take or apply it as told by your health  care provider. Do not stop using the antibiotic even if you start to feel better or if your condition improves. Do not use heating pads or ice packs on the affected area. Avoid having blood draws, IV insertions, or blood pressure checks on the affected limb. Keep all follow-up visits. This is important. Contact a health care provider if you: Continue to have swelling in your limb. Have fluid leaking from the skin of your swollen limb. Have a cut that does not heal. Have redness or pain in the affected area. Develop purplish spots, rash, blisters, or sores (lesions) on your affected limb. Get help right away if you: Have new swelling in your limb that starts suddenly. Have shortness of breath or chest pain. Have a fever or chills. These symptoms may represent a serious problem that is an emergency. Do not wait to see if the symptoms will go away. Get medical help right away. Call your local emergency services (911 in the [place] Do not drive yourself to the hospital. Summary Lymphedema is swelling that is caused by the abnormal collection of lymph in the tissues under the skin. Lymph is fluid from the tissues in your body that is removed  through the lymphatic system. This system collects and carries excess fluid, fats, proteins, and wastes from the tissues of the body to the bloodstream. Lymphedema causes swelling, pain, and redness in the affected area. This may include the trunk area, or an arm or leg. Treatment for this condition may depend on the cause of your lymphedema. Treatment may include treating the underlying cause, complete decongestive therapy (CDT), compression methods, or surgery. This information is not intended to replace advice given to you by your health care provider. Make sure you discuss any questions you have with your healthcare provider. Document Revised: 12/24/2019 Document Reviewed: 12/24/2019 Elsevier Patient Education  2022 [employer].

## 2020-10-01 LAB — URINALYSIS, ROUTINE W REFLEX MICROSCOPIC
Bacteria, UA: NONE SEEN /HPF
Bilirubin Urine: NEGATIVE
Glucose, UA: NEGATIVE
Hgb urine dipstick: NEGATIVE
Ketones, ur: NEGATIVE
Nitrite: NEGATIVE
RBC / HPF: NONE SEEN /HPF (ref 0–2)
Specific Gravity, Urine: 1.014 (ref 1.001–1.035)
pH: 6.5 (ref 5.0–8.0)

## 2020-10-01 LAB — MICROSCOPIC MESSAGE

## 2020-10-04 ENCOUNTER — Other Ambulatory Visit: Payer: Self-pay | Admitting: Internal Medicine

## 2020-10-04 DIAGNOSIS — E559 Vitamin D deficiency, unspecified: Secondary | ICD-10-CM | POA: Insufficient documentation

## 2020-10-04 DIAGNOSIS — D649 Anemia, unspecified: Secondary | ICD-10-CM | POA: Insufficient documentation

## 2020-10-04 DIAGNOSIS — N179 Acute kidney failure, unspecified: Secondary | ICD-10-CM | POA: Insufficient documentation

## 2020-10-04 DIAGNOSIS — R7989 Other specified abnormal findings of blood chemistry: Secondary | ICD-10-CM

## 2020-10-04 MED ORDER — CHOLECALCIFEROL 1.25 MG (50000 UT) PO CAPS: 50000.0000 [IU] | ORAL_CAPSULE | ORAL | 1 refills | Status: DC

## 2020-10-14 ENCOUNTER — Telehealth: Payer: Self-pay | Admitting: Internal Medicine

## 2020-10-14 ENCOUNTER — Ambulatory Visit: Payer: Self-pay | Admitting: Internal Medicine

## 2020-10-14 ENCOUNTER — Other Ambulatory Visit: Payer: Self-pay

## 2020-10-14 NOTE — Telephone Encounter (Signed)
Patient no-showed today's appointment; appointment was for 10/14/20, provider notified for review of record. Letter sent for patient to call in and re-schedule.   

## 2020-10-21 ENCOUNTER — Telehealth: Payer: BC Managed Care – PPO | Admitting: Internal Medicine

## 2020-10-21 ENCOUNTER — Telehealth: Payer: Self-pay | Admitting: Internal Medicine

## 2020-10-21 NOTE — Telephone Encounter (Signed)
No answer, no voicemail.  Attempting to start 10:00 am virtual visit

## 2020-10-21 NOTE — Telephone Encounter (Signed)
No answer, no voicemail.  Will be considered a now show if unable to contact Patient by 10:15 am.

## 2020-10-21 NOTE — Telephone Encounter (Signed)
Patient no-showed today's appointment; appointment was for 10/21/20, provider notified for review of record. Letter sent for patient to call in and re-schedule.   

## 2020-10-25 NOTE — Progress Notes (Signed)
Patient no showed appointment.   No answer, no voicemail.

## 2020-10-27 ENCOUNTER — Other Ambulatory Visit: Payer: Self-pay

## 2020-10-27 DIAGNOSIS — R944 Abnormal results of kidney function studies: Secondary | ICD-10-CM

## 2020-10-27 NOTE — Progress Notes (Signed)
met 

## 2020-11-08 ENCOUNTER — Other Ambulatory Visit (INDEPENDENT_AMBULATORY_CARE_PROVIDER_SITE_OTHER): Payer: BC Managed Care – PPO

## 2020-11-08 ENCOUNTER — Other Ambulatory Visit: Payer: Self-pay

## 2020-11-08 DIAGNOSIS — D649 Anemia, unspecified: Secondary | ICD-10-CM

## 2020-11-08 DIAGNOSIS — N179 Acute kidney failure, unspecified: Secondary | ICD-10-CM

## 2020-11-08 LAB — BASIC METABOLIC PANEL
BUN: 23 mg/dL (ref 6–23)
CO2: 28 mEq/L (ref 19–32)
Calcium: 9 mg/dL (ref 8.4–10.5)
Chloride: 102 mEq/L (ref 96–112)
Creatinine, Ser: 1.65 mg/dL — ABNORMAL HIGH (ref 0.40–1.50)
GFR: 54.6 mL/min — ABNORMAL LOW (ref >60.00)
Glucose, Bld: 120 mg/dL — ABNORMAL HIGH (ref 70–99)
Potassium: 3.6 mEq/L (ref 3.5–5.1)
Sodium: 139 mEq/L (ref 135–145)

## 2020-11-09 ENCOUNTER — Telehealth: Payer: Self-pay | Admitting: Internal Medicine

## 2020-11-09 NOTE — Telephone Encounter (Signed)
Patient returned office phone call about lab results. 

## 2020-12-28 ENCOUNTER — Telehealth: Payer: Self-pay

## 2020-12-28 ENCOUNTER — Ambulatory Visit: Payer: BC Managed Care – PPO | Admitting: Internal Medicine

## 2020-12-28 NOTE — Telephone Encounter (Signed)
3rd no show please dismiss

## 2020-12-28 NOTE — Telephone Encounter (Signed)
Patient no-showed today's appointment;  Appointment was scheduled for 12/28/2020, Provider notified for review of record.

## 2021-01-07 ENCOUNTER — Encounter: Payer: Self-pay | Admitting: Internal Medicine

## 2021-01-07 NOTE — Telephone Encounter (Signed)
I can't dismiss without sending a no show letter first. If he no shows after that then he can be dismissed.

## 2021-01-07 NOTE — Telephone Encounter (Signed)
It has been noted that Patient had 2 previous no show letters in his chart. Encounter has been handled.

## 2021-01-07 NOTE — Telephone Encounter (Signed)
He needs no show letter x 3 before dismissal  Check on this    He has no showed or missed appt like 3-4 x  Check on letters please

## 2021-08-11 ENCOUNTER — Other Ambulatory Visit: Payer: Self-pay

## 2021-08-11 DIAGNOSIS — K851 Biliary acute pancreatitis without necrosis or infection: Secondary | ICD-10-CM | POA: Insufficient documentation

## 2021-08-11 DIAGNOSIS — K8050 Calculus of bile duct without cholangitis or cholecystitis without obstruction: Secondary | ICD-10-CM | POA: Insufficient documentation

## 2021-08-11 DIAGNOSIS — I10 Essential (primary) hypertension: Secondary | ICD-10-CM | POA: Insufficient documentation

## 2021-08-11 LAB — CBC
HCT: 39.8 % (ref 39.0–52.0)
Hemoglobin: 12 g/dL — ABNORMAL LOW (ref 13.0–17.0)
MCH: 23.2 pg — ABNORMAL LOW (ref 26.0–34.0)
MCHC: 30.2 g/dL (ref 30.0–36.0)
MCV: 77 fL — ABNORMAL LOW (ref 80.0–100.0)
Platelets: 231 10*3/uL (ref 150–400)
RBC: 5.17 MIL/uL (ref 4.22–5.81)
RDW: 17.7 % — ABNORMAL HIGH (ref 11.5–15.5)
WBC: 10.1 10*3/uL (ref 4.0–10.5)
nRBC: 0 % (ref 0.0–0.2)

## 2021-08-11 NOTE — ED Notes (Addendum)
Pt sitting in [facility] yelling off and on, more when a staff member walks by.

## 2021-08-11 NOTE — ED Triage Notes (Signed)
Pt presents via POV c/o abd pain x1 week. Reports N/V.

## 2021-08-12 ENCOUNTER — Emergency Department: Payer: Self-pay

## 2021-08-12 ENCOUNTER — Emergency Department
Admission: EM | Admit: 2021-08-12 | Discharge: 2021-08-12 | Disposition: A | Discharge status: Other Acute Inpatient Hospital | Payer: Self-pay | Attending: Emergency Medicine | Admitting: Emergency Medicine

## 2021-08-12 DIAGNOSIS — K8051 Calculus of bile duct without cholangitis or cholecystitis with obstruction: Secondary | ICD-10-CM

## 2021-08-12 DIAGNOSIS — K8050 Calculus of bile duct without cholangitis or cholecystitis without obstruction: Secondary | ICD-10-CM

## 2021-08-12 DIAGNOSIS — K851 Biliary acute pancreatitis without necrosis or infection: Secondary | ICD-10-CM

## 2021-08-12 LAB — COMPREHENSIVE METABOLIC PANEL
ALT: 255 U/L — ABNORMAL HIGH (ref 0–44)
AST: 192 U/L — ABNORMAL HIGH (ref 15–41)
Albumin: 3.7 g/dL (ref 3.5–5.0)
Alkaline Phosphatase: 212 U/L — ABNORMAL HIGH (ref 38–126)
Anion gap: 9 (ref 5–15)
BUN: 17 mg/dL (ref 6–20)
CO2: 25 mmol/L (ref 22–32)
Calcium: 9.5 mg/dL (ref 8.9–10.3)
Chloride: 101 mmol/L (ref 98–111)
Creatinine, Ser: 1.84 mg/dL — ABNORMAL HIGH (ref 0.61–1.24)
GFR, Estimated: 49 mL/min — ABNORMAL LOW (ref >60)
Glucose, Bld: 112 mg/dL — ABNORMAL HIGH (ref 70–99)
Potassium: 3.2 mmol/L — ABNORMAL LOW (ref 3.5–5.1)
Sodium: 135 mmol/L (ref 135–145)
Total Bilirubin: 4.5 mg/dL — ABNORMAL HIGH (ref 0.3–1.2)
Total Protein: 8.5 g/dL — ABNORMAL HIGH (ref 6.5–8.1)

## 2021-08-12 LAB — BILIRUBIN, DIRECT: Bilirubin, Direct: 2.8 mg/dL — ABNORMAL HIGH (ref 0.0–0.2)

## 2021-08-12 LAB — LIPASE, BLOOD: Lipase: 923 U/L — ABNORMAL HIGH (ref 11–51)

## 2021-08-12 MED ORDER — LABETALOL HCL 5 MG/ML IV SOLN
20.0000 mg | Freq: Once | INTRAVENOUS | Status: AC
  Administered 2021-08-12: 20 mg via INTRAVENOUS
  Filled 2021-08-12: qty 4

## 2021-08-12 MED ORDER — LORAZEPAM 2 MG/ML IJ SOLN: 1.0000 mg | INTRAMUSCULAR | Status: DC | PRN

## 2021-08-12 MED ORDER — HYDROMORPHONE HCL 1 MG/ML IJ SOLN
0.5000 mg | Freq: Once | INTRAMUSCULAR | Status: AC
  Administered 2021-08-12: 0.5 mg via INTRAVENOUS
  Filled 2021-08-12: qty 0.5

## 2021-08-12 MED ORDER — LACTATED RINGERS IV BOLUS
1000.0000 mL | Freq: Once | INTRAVENOUS | Status: AC
  Administered 2021-08-12: 1000 mL via INTRAVENOUS

## 2021-08-12 MED ORDER — HYDROMORPHONE HCL 1 MG/ML IJ SOLN
1.0000 mg | Freq: Once | INTRAMUSCULAR | Status: AC
  Administered 2021-08-12: 1 mg via INTRAVENOUS
  Filled 2021-08-12: qty 1

## 2021-08-12 MED ORDER — LABETALOL HCL 5 MG/ML IV SOLN
10.0000 mg | Freq: Once | INTRAVENOUS | Status: AC
  Administered 2021-08-12: 10 mg via INTRAVENOUS
  Filled 2021-08-12: qty 4

## 2021-08-12 MED ORDER — SODIUM CHLORIDE 0.9 % IV BOLUS
1000.0000 mL | Freq: Once | INTRAVENOUS | Status: AC
  Administered 2021-08-12: 1000 mL via INTRAVENOUS

## 2021-08-12 MED ORDER — MORPHINE SULFATE (PF) 4 MG/ML IV SOLN
4.0000 mg | Freq: Once | INTRAVENOUS | Status: AC
  Administered 2021-08-12: 4 mg via INTRAVENOUS
  Filled 2021-08-12: qty 1

## 2021-08-12 MED ORDER — ONDANSETRON HCL 4 MG/2ML IJ SOLN
4.0000 mg | Freq: Once | INTRAMUSCULAR | Status: AC
  Administered 2021-08-12: 4 mg via INTRAVENOUS
  Filled 2021-08-12: qty 2

## 2021-08-12 MED ORDER — LABETALOL HCL 5 MG/ML IV SOLN
40.0000 mg | Freq: Once | INTRAVENOUS | Status: AC
  Administered 2021-08-12: 40 mg via INTRAVENOUS
  Filled 2021-08-12: qty 8

## 2021-08-12 NOTE — ED Notes (Signed)
Bariatric bed ordered for patient.

## 2021-08-12 NOTE — ED Provider Notes (Addendum)
[facility] Provider Note    Event Date/Time   First MD Initiated Contact with Patient 08/12/21 0009     (approximate)   History   Chief Complaint Abdominal Pain   HPI  Darrell Campbell is a 31 y.o. male with past medical history of hypertension and gout who presents to the ED complaining of abdominal pain.  Patient reports that he has been dealing with about 1 week of intermittent pain in his epigastrium and right upper quadrant that has become constant over the past 24 hours.  Pain has been associated with intermittent nausea and multiple episodes of vomiting, but he denies any diarrhea.  He has not noticed any blood in his emesis or stool.  He denies any fevers, dysuria, hematuria, or flank pain.  He thought his symptoms were related to food poisoning, but eventually decided to seek care when they did not improve.  He denies any history of similar symptoms and has never been told he has gallstones.  He denies any prior abdominal surgeries.     Physical Exam   Triage Vital Signs: ED Triage Vitals  Enc Vitals Group     BP 08/11/21 2152 (!) 212/75     Pulse Rate 08/11/21 2151 77     Resp 08/11/21 2151 14     Temp 08/11/21 2151 98.3 F (36.8 C)     Temp Source 08/11/21 2151 Oral     SpO2 08/11/21 2151 94 %     Weight --      Height 08/11/21 2144 6' (1.829 m)     Head Circumference --      Peak Flow --      Pain Score 08/11/21 2144 10     Pain Loc --      Pain Edu? --      Excl. in GC? --     Most recent vital signs: Vitals:   08/12/21 0433 08/12/21 0610  BP: (!) 202/124 (!) 226/143  Pulse: 81 84  Resp: 20 20  Temp:    SpO2: 91% 96%    Constitutional: Alert and oriented.  Morbidly obese. Eyes: Conjunctivae are normal. Head: Atraumatic. Nose: No congestion/rhinnorhea. Mouth/Throat: Mucous membranes are moist.  Cardiovascular: Normal rate, regular rhythm. Grossly normal heart sounds.  2+ radial pulses bilaterally. Respiratory: Normal  respiratory effort.  No retractions. Lungs CTAB. Gastrointestinal: Soft and tender to palpation in the epigastrium and right upper quadrant with no rebound or guarding. No distention. Musculoskeletal: No lower extremity tenderness nor edema.  Neurologic:  Normal speech and language. No gross focal neurologic deficits are appreciated.    ED Results / Procedures / Treatments   Labs (all labs ordered are listed, but only abnormal results are displayed) Labs Reviewed  COMPREHENSIVE METABOLIC PANEL - Abnormal; Notable for the following components:      Result Value   Potassium 3.2 (*)    Glucose, Bld 112 (*)    Creatinine, Ser 1.84 (*)    Total Protein 8.5 (*)    AST 192 (*)    ALT 255 (*)    Alkaline Phosphatase 212 (*)    Total Bilirubin 4.5 (*)    GFR, Estimated 49 (*)    All other components within normal limits  CBC - Abnormal; Notable for the following components:   Hemoglobin 12.0 (*)    MCV 77.0 (*)    MCH 23.2 (*)    RDW 17.7 (*)    All other components within normal limits  BILIRUBIN, DIRECT - Abnormal; Notable for the following components:   Bilirubin, Direct 2.8 (*)    All other components within normal limits  LIPASE, BLOOD - Abnormal; Notable for the following components:   Lipase 923 (*)    All other components within normal limits  URINALYSIS, ROUTINE W REFLEX MICROSCOPIC     EKG  ED ECG REPORT I, Chesley Noonharles Sophiarose Eades, the attending physician, personally viewed and interpreted this ECG.   Date: 08/12/2021  EKG Time: 21:47  Rate: 80  Rhythm: normal sinus rhythm  Axis: Normal  Intervals:none  ST&T Change: None  RADIOLOGY Right upper quadrant ultrasound reviewed and interpreted by me with gallstones noted but no wall thickening or pericholecystic fluid, CBD dilation noted.  PROCEDURES:  Critical Care performed: Yes, see critical care procedure note(s)  .Critical Care Performed by: Chesley NoonJessup, Aristeo Hankerson, MD Authorized by: Chesley NoonJessup, Velecia Ovitt, MD   Critical care  provider statement:    Critical care time (minutes):  45   Critical care time was exclusive of:  Separately billable procedures and treating other patients and teaching time   Critical care was necessary to treat or prevent imminent or life-threatening deterioration of the following conditions:  Hepatic failure   Critical care was time spent personally by me on the following activities:  Development of treatment plan with patient or surrogate, discussions with consultants, evaluation of patient's response to treatment, examination of patient, ordering and review of laboratory studies, ordering and review of radiographic studies, ordering and performing treatments and interventions, pulse oximetry, re-evaluation of patient's condition and review of old charts   I assumed direction of critical care for this patient from another provider in my specialty: no     Care discussed with: admitting provider     MEDICATIONS ORDERED IN ED: Medications  morphine (PF) 4 MG/ML injection 4 mg (4 mg Intravenous Given 08/12/21 0034)  ondansetron (ZOFRAN) injection 4 mg (4 mg Intravenous Given 08/12/21 0034)  sodium chloride 0.9 % bolus 1,000 mL (0 mLs Intravenous Stopped 08/12/21 0132)  HYDROmorphone (DILAUDID) injection 0.5 mg (0.5 mg Intravenous Given 08/12/21 0346)     IMPRESSION / MDM / ASSESSMENT AND PLAN / ED COURSE  I reviewed the triage vital signs and the nursing notes.                              31 y.o. male with past medical history of hypertension and gout who presents to the ED with 1 week of intermittent pain in his epigastrium and right upper quadrant that has become constant and severe over the past 24 hours with nausea and multiple episodes of vomiting.  Patient's presentation is most consistent with acute presentation with potential threat to life or bodily function.  Differential diagnosis includes, but is not limited to, pancreatitis, cholecystitis, biliary colic, hepatitis, gastritis, bowel  obstruction, UTI, kidney stone.  Patient uncomfortable appearing but in no acute distress, vital signs remarkable for elevated blood pressure but otherwise reassuring.  Patient has significant tenderness in his epigastrium and his right upper quadrant, initial labs significant for transaminitis along with elevated alkaline phosphatase and bilirubin.  We will add on direct bilirubin to determine if this is a direct or indirect bilirubinemia.  Plan to further assess with right upper quadrant ultrasound for signs of cholecystitis or biliary obstruction.  We will treat symptomatically with IV morphine and Zofran, hydrate with IV fluids.  BMP shows mild AKI, CBC without anemia or leukocytosis.  Right  upper quadrant ultrasound shows cholelithiasis without evidence of cholecystitis, additionally notes dilated CBD concerning for choledocholithiasis.  Patient noted to have elevated lipase and he likely also has pancreatitis related to choledocholithiasis.  Direct bilirubin is elevated consistent with biliary obstruction.  MRCP was attempted, however patient is too large to fit into MRI scanner here at [facility].  He will require transfer as ERCP not currently available here at [facility].  Per patient preference, case discussed with transfer center at [facility], unfortunately [facility] is currently at capacity.  I have reached out to the transfer center at [facility] and am currently awaiting callback from hospitalist service there.  Patient turned over to oncoming provider pending further discussion regarding transfer.      FINAL CLINICAL IMPRESSION(S) / ED DIAGNOSES   Final diagnoses:  Calculus of bile duct without cholecystitis with obstruction  Acute biliary pancreatitis without infection or necrosis     Rx / DC Orders   ED Discharge Orders     None        Note:  This document was prepared using Dragon voice recognition software and may include unintentional dictation errors.   Chesley Noon, MD 08/12/21 9417     Chesley Noon, MD 08/12/21 0700

## 2021-08-12 NOTE — ED Notes (Signed)
Called [facility] spoke to [place], gave should and abdominal girth measurements  [phone]

## 2021-08-12 NOTE — ED Notes (Signed)
Patient taken to MRI

## 2021-08-12 NOTE — ED Provider Notes (Signed)
Patient signed out to me at 7 AM pending transfer.  He is a 31 year old male with morbid obesity who presents with abdominal pain x1 week.  Ultrasound and laboratory evaluation is consistent with choledocholithiasis.  Patient unfortunately did not fit in the MRI scanner.  Provider performing has reached out to [facility] and he is pending further discussion with GI for acceptance.  Pt is accepted to [facility] for ERCP in the OR on Monday.    Georga Hacking, MD 08/12/21 [phone]

## 2021-08-12 NOTE — ED Notes (Signed)
Patient accepted to [facility] [phone],  [facility] they are out of trucks and nothing till later this evening, called [facility] for transport and will call with verification.

## 2021-08-12 NOTE — ED Notes (Signed)
[facility] called and Dr. Felicita Gage would like to change bed to stepdown, cancelled [facility] transport

## 2021-08-12 NOTE — ED Notes (Signed)
Patient resting quietly at this time.  Respirations even and unlabored.  Patient reports he does have sleep apnea.

## 2021-08-12 NOTE — ED Notes (Signed)
[facility] is backed up for transport called [employer] and is on list for their transport

## 2021-08-12 NOTE — ED Notes (Signed)
Ultrasound at bedside

## 2021-08-12 NOTE — ED Notes (Signed)
Shoulder girth measurement: 64"   Abdominal girth measurement: 81"

## 2021-08-12 NOTE — ED Notes (Signed)
[facility] reviewed by this RN.  

## 2021-08-12 NOTE — ED Notes (Signed)
Report given to Albertine Grates, Therapist, sports at [facility].

## 2021-08-12 NOTE — ED Notes (Signed)
Shona Needles called with bed update  patient going [facility]  called [facility] to transport

## 2021-08-15 ENCOUNTER — Encounter: Payer: Self-pay | Admitting: Physician Assistant

## 2021-08-17 NOTE — Discharge Summary (Signed)
 "  [facility] Medicine Discharge Summary  Admit Date: 08/12/2021 Discharge Date: 08/17/2021  Admitting Physician: Camellia Rosella Seip, MD Discharge Physician: Rodgers Gail, MD  Primary Care Provider: McLean-Scocuzza, Randine Garre, MD, Phone [phone]  Discharge Destination: Home  Admission Diagnoses:  needs ERCP  Discharge Diagnoses:  Principal Problem:   Calculus of bile duct without cholecystitis with obstruction Active Problems:   Essential hypertension   Morbid obesity with BMI of 60.0-69.9, adult (CMS-HCC)   OSA (obstructive sleep apnea)   Gallstone pancreatitis   Epigastric pain   Non-intractable vomiting with nausea   AKI (acute kidney injury) (CMS-HCC)   Type 2 diabetes mellitus without complication, without long-term current use of insulin (CMS-HCC)   Hypokalemia Resolved Problems:   * No resolved hospital problems. *  Primary Diagnosis: Admitted for  Cholelithiasis Gallbladder Pancreatitis Cholecystitis Morbid Obesity OSA  Changes Made: see med list  Anticipatory Guidance for Outpatient Provider:  Referral placed in Epic Sleep study  Recommended Follow-up Studies:      Results Pending at Discharge:  None Please see phone numbers at end of this summary for lab contact information.   Follow-up/Care Transition Plan: Future Appointments  Date Time Provider Department Center  09/15/2021  9:30 AM Cammie, [facility] [place], [facility]   [facility] Provider Follow-up: none    Allergies/Intolerances:  No Known Allergies   New Adverse Drug Events: none  Medications:   Current Discharge Medication List     START taking these medications   Details  acetaminophen  (TYLENOL ) 325 MG tablet Take 2 tablets (650 mg total) by mouth every 8 (eight) hours for 10 days Qty: 30 tablet, Refills: 0    amLODIPine  (NORVASC ) 10 MG tablet Take 1 tablet (10 mg total) by mouth once daily for 90 days Qty: 30 tablet, Refills: 2     carvediloL (COREG) 6.25 MG tablet Take 1 tablet (6.25 mg total) by mouth 2 (two) times daily with meals for 90 days Qty: 60 tablet, Refills: 2    hydrALAZINE (APRESOLINE) 50 MG tablet Take 0.5 tablets (25 mg total) by mouth 3 (three) times daily for 90 days Qty: 45 tablet, Refills: 2    HYDROmorphone  (DILAUDID ) 2 MG tablet Take 1 tablet (2 mg total) by mouth every 6 (six) hours as needed for Pain for up to 5 days Qty: 20 tablet, Refills: 0       CONTINUE these medications which have NOT CHANGED   Details  ferrous sulfate  325 (65 FE) MG EC tablet Take 325 mg by mouth daily with breakfast    multivitamin tablet Take 1 tablet by mouth once daily       STOP taking these medications     losartan -hydroCHLOROthiazide  (HYZAAR) 50-12.5 mg tablet          Brief History of Present Illness: Darrell Campbell is a 31 y.o. male with morbid obesity (BMI >60), suspected OSA, T2DM who presents as transfer from OSH ED for choledocholithiasis and gallstone pancreatitis.  _____________________  Hospital Course by Problem: Choledocholithiasis, complicated by gallstone pancreatitis, abnormal LFTs, epigastric pain, N/V.   He was transferred to [facility] for GI/Surgical evaluation and treatment ERCP performed ,and CBD cleared and sphincterotomy performed successfully. Surgery consulted,and planned and performed   Lap Choly. Post op he did well,and cleared for DC home.   Hypoglycemia: Resolved once po intake resumed.   AKI, with hypokalemia.  Cr 1.8 at OSH ED, compared to 1.5 in 2020.   Cr down to  1.5   HTN.   He had been prescribed losartan /HCTZ 60/12.5 mg but reportedly had not been taking every day. We will increase Norvasc  to 10 mg daily and initiated Coreg 12.5mg  twice daily continue to monitor his blood pressure trend and adjust medications as needed.   Added Hydralazine 50mg  tid.    Morbid obesity, with suspected OSA.   BMI of 63, he endorses OSA symptoms although denies  having device prescribed in the past.  He was noted to drop sats during sleep,so he was set up for 2L O2 at bed time,referral placed in Epic for outpatient  sleep study to be able to obtainn a CPAP device.  T2DM.   Per review of prior labs, HgbA1c was 7.1% in 2020.  Does not take any meds for diabetes Continue Sliding scale Lispro Current  A1c 5.8.   Surgeries and Procedures Performed:  Procedure(s): ERCP LAPAROSCOPY, SURGICAL; CHOLECYSTECTOMY  ERCP: An impacted stone was seen in the major papilla. - Cholangiogram quality was suboptimal given patient body habitus. - The entire main bile duct was severely dilated, with a stone causing an obstruction. - Choledocholithiasis was found. Complete removal was accomplished by biliary sphincterotomy and balloon extraction. Occlusion cholangiogram confirmed a clear duct devoid of defects. - No pancreatogram performed. - Indomethacin given to decrease risk of post-ERCP pancreatitis. _____________________  Discharge Exam:  BP (!) 169/104 (BP Location: Left forearm, BP Cuff Size: Adult)   Pulse 75   Temp 37 C (98.6 F) (Oral)   Resp 20   Ht 185.4 cm (6' 0.99)   Wt (!) 217.2 kg (478 lb 13.4 oz)   SpO2 99%   BMI 63.19 kg/m  O2 Device: Nasal cannula (08/17/21 1142) General appearance: alert, appears stated age, cooperative, and morbidly obese Neck: no adenopathy, no carotid bruit, no JVD, supple, symmetrical, trachea midline, and thyroid  not enlarged, symmetric, no tenderness/mass/nodules Lungs: diminished breath sounds bibasilar and bilaterally Abdomen: soft, non-tender; bowel sounds normal; no masses,  no organomegaly Extremities: edema mild edema bilaterally Neurologic: Grossly normal  Pertinent Lab Testing: Recent Labs  Lab 08/15/21 0457 08/16/21 0843 08/17/21 0631  NA 135 137 137  K 3.6 3.8 3.6  CL 101 104 105  CO2 26 27 24   BUN 12 12 16   CREATININE 1.5* 1.4* 1.7*  GLUCOSE 95 89 84  CALCIUM 8.7 8.8 8.5*   Recent Labs   Lab 08/15/21 0457 08/16/21 0843 08/17/21 0631  AST 54* 48* 62*  ALT 117* 99* 83*  ALKPHOS 200* 198* 166*  TBILI 3.0* 2.0* 1.6*    Recent Labs  Lab 08/15/21 0457 08/16/21 0843 08/17/21 0631  WBC 12.4* 12.5* 13.7*  HGB 9.7* 9.6* 9.3*  HCT 31.8* 30.8* 31.4*  PLT 217 238 272   Recent Labs  Lab 08/12/21 2217  APTT 37.0  INR 1.2*       Pertinent Imaging:  X-ray fluoro less than 1 hour  Result Date: 08/15/2021 This order has been auto-finalized. Please see additional clinical documentation for result report.  Ultrasound abdomen reference only  Result Date: 08/12/2021 This order has been auto-finalized.  Please see additional clinical documentation for result report.    _____________________  Code Status: Full Code   Status on Discharge:  Cognitive: normal ADLs: normal Current activity: Walks occasionally (08/17/21 0726) Current mobility: Slightly limited (08/17/21 0726)  Activity Recommendation: activity as tolerated   Diet (including supplements/tube feeds): Diet regular _____________________  Time spent on discharge process: 35 minutes    LUCINA KAYS, MD [facility]  08/17/2021   Hospital Contact Information:  [facility]) [facility]) [facility])  Pending tests:  Laboratory: [phone] Microbiology: [phone] Pathology: [phone] Radiology: [phone]  General questions: [phone] Pending tests: Laboratory: [phone] Microbiology: [phone] Pathology: [phone] Radiology: [phone]  General questions:  [phone] Pending tests:  Laboratory: [phone] Microbiology: [phone] Pathology: [phone] Radiology: [phone]  General questions:  [phone]   "

## 2021-08-19 ENCOUNTER — Inpatient Hospital Stay: Payer: Self-pay | Admitting: Internal Medicine

## 2022-07-04 ENCOUNTER — Ambulatory Visit: Payer: Self-pay | Admitting: Family Medicine

## 2022-07-04 ENCOUNTER — Encounter: Payer: Self-pay | Admitting: Family Medicine

## 2022-07-04 DIAGNOSIS — Z113 Encounter for screening for infections with a predominantly sexual mode of transmission: Secondary | ICD-10-CM

## 2022-07-04 LAB — HM HIV SCREENING LAB: HM HIV Screening: NEGATIVE

## 2022-07-04 NOTE — Progress Notes (Signed)
Here today for STD screening. Accepts bloodwork. Augustina Braddock, RN ° °

## 2022-07-04 NOTE — Progress Notes (Signed)
[facility] Department STI clinic/screening visit  Subjective:  Darrell Campbell is a 32 y.o. male being seen today for an STI screening visit. The patient reports they do not have symptoms.    Patient has the following medical conditions:   Patient Active Problem List   Diagnosis Date Noted   Vitamin D deficiency 10/04/2020   Anemia 10/04/2020   AKI (acute kidney injury) 10/04/2020   Lymphedema 09/30/2020   Prediabetes 09/30/2020   Hypertension 09/30/2020   Morbid obesity 09/30/2020   OSA (obstructive sleep apnea) 09/30/2020   Venous stasis dermatitis of both lower extremities 09/30/2020   Iron deficiency anemia 11/19/2018   Essential hypertension 07/30/2018   Morbid obesity 07/30/2018     Chief Complaint  Patient presents with   SEXUALLY TRANSMITTED DISEASE    Screening    HPI  Patient reports to clinic for STI testing. Denies symptoms today.   Last HIV test per patient/review of record was  Lab Results  Component Value Date   HMHIVSCREEN Negative - Validated 08/14/2019   No results found for: "HIV"  Does the patient or their partner desires a pregnancy in the next year? No  Screening for MPX risk: Does the patient have an unexplained rash? No Is the patient MSM? No Does the patient endorse multiple sex partners or anonymous sex partners? No Did the patient have close or sexual contact with a person diagnosed with MPX? No Has the patient traveled outside the [place] where MPX is endemic? No Is there a high clinical suspicion for MPX-- evidenced by one of the following No  -Unlikely to be chickenpox  -Lymphadenopathy  -Rash that present in same phase of evolution on any given body part   See flowsheet for further details and programmatic requirements.   Immunization History  Administered Date(s) Administered   PFIZER(Purple Top)SARS-COV-2 Vaccination 07/13/2019, 08/03/2019     The following portions of the patient's history were reviewed and  updated as appropriate: allergies, current medications, past medical history, past social history, past surgical history and problem list.  Objective:  There were no vitals filed for this visit.  Physical Exam Vitals and nursing note reviewed.  Constitutional:      Appearance: He is obese.  HENT:     Head: Normocephalic and atraumatic.     Mouth/Throat:     Mouth: Mucous membranes are moist.     Pharynx: No oropharyngeal exudate or posterior oropharyngeal erythema.  Eyes:     General:        Right eye: No discharge.        Left eye: No discharge.     Conjunctiva/sclera:     Right eye: Right conjunctiva is not injected. No exudate.    Left eye: Left conjunctiva is not injected. No exudate. Pulmonary:     Effort: Pulmonary effort is normal.  Abdominal:     General: Abdomen is flat.     Palpations: Abdomen is soft. There is no hepatomegaly or mass.     Tenderness: There is no abdominal tenderness. There is no rebound.  Genitourinary:    Comments: Declined genital exam- asymptomatic Lymphadenopathy:     Cervical: No cervical adenopathy.     Upper Body:     Right upper body: No supraclavicular or axillary adenopathy.     Left upper body: No supraclavicular or axillary adenopathy.  Skin:    General: Skin is warm and dry.  Neurological:     Mental Status: He is alert and oriented to  person, place, and time.    Assessment and Plan:  Darrell Campbell is a 32 y.o. male presenting to the [facility] Department for STI screening  1. Screening for venereal disease Declines rectal swabbing today  - Chlamydia/GC NAA, Confirmation - HIV [facility] - Syphilis Serology, [facility]  Patient does not have STI symptoms Patient accepted all screenings including  urine GC/Chlamydia, and blood work for HIV/Syphilis. Patient meets criteria for HepB screening? No. Ordered? not applicable Patient meets criteria for HepC screening? No. Ordered? not applicable Recommended  condom use with all sex Discussed importance of condom use for STI prevent  Treat positive test results per standing order. Discussed time line for [facility] results and that patient will be called with positive results and encouraged patient to call if he had not heard in 2 weeks Recommended repeat testing in 3 months with positive results. Recommended returning for continued or worsening symptoms.   Return if symptoms worsen or fail to improve, for STI screening.  No future appointments. Total time spent 20 minutes  Lenice Llamas, [place]

## 2022-07-06 LAB — CHLAMYDIA/GC NAA, CONFIRMATION
Chlamydia trachomatis, NAA: NEGATIVE
Neisseria gonorrhoeae, NAA: NEGATIVE

## 2022-10-11 IMAGING — US US ABDOMEN LIMITED
1 series · 14 of 25 positions shown · non-contrast
Comparison: None Available.

CLINICAL DATA: Right upper quadrant pain for 1 week.

EXAM:
ULTRASOUND ABDOMEN LIMITED RIGHT UPPER QUADRANT

[Series 1: us abdomen limited ruq (liver/gb) · 14 of 49 slices shown]
[im 1/49]
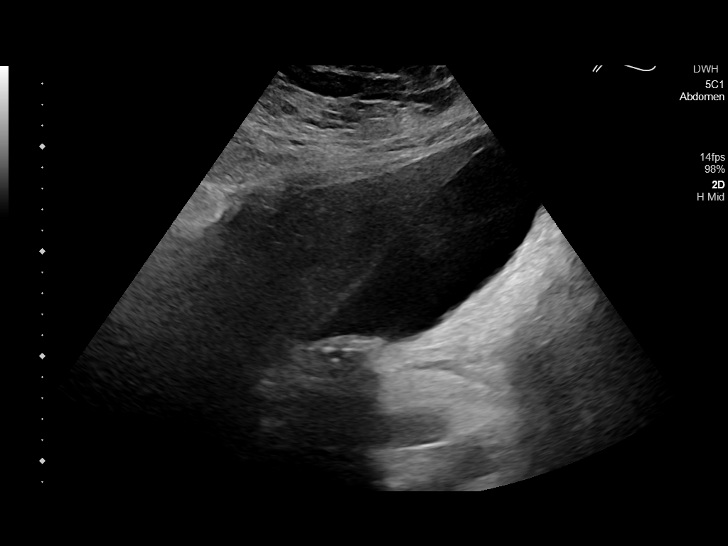
[im 5/49]
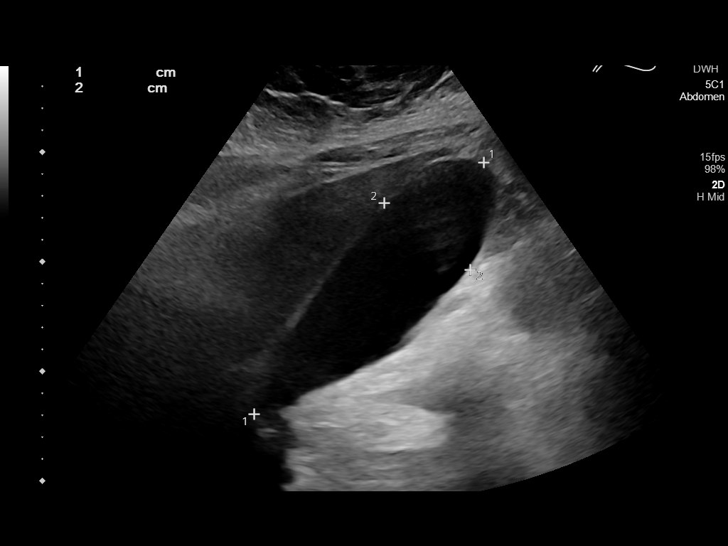
[im 9/49]
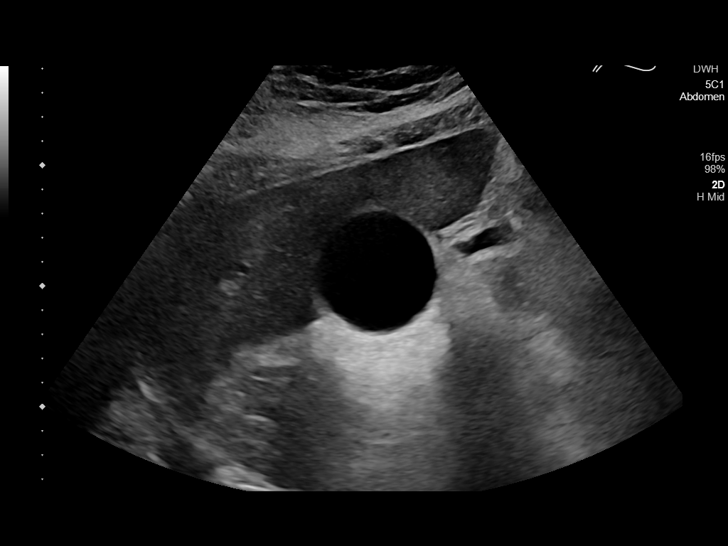
[im 13/49]
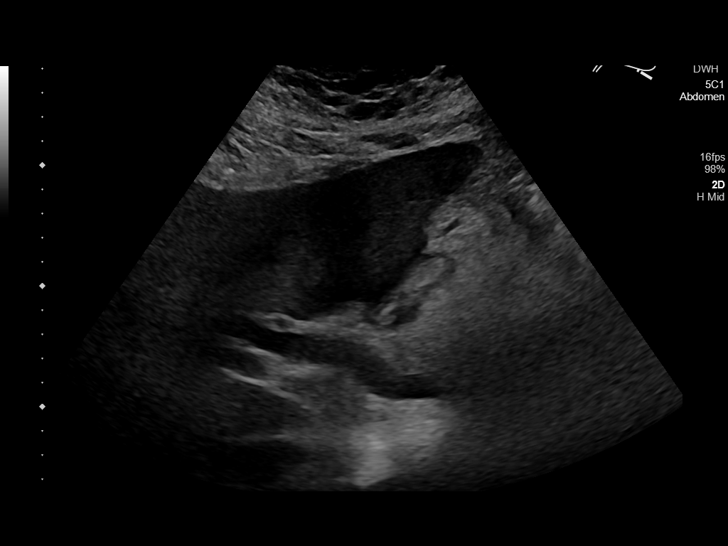
[im 17/49]
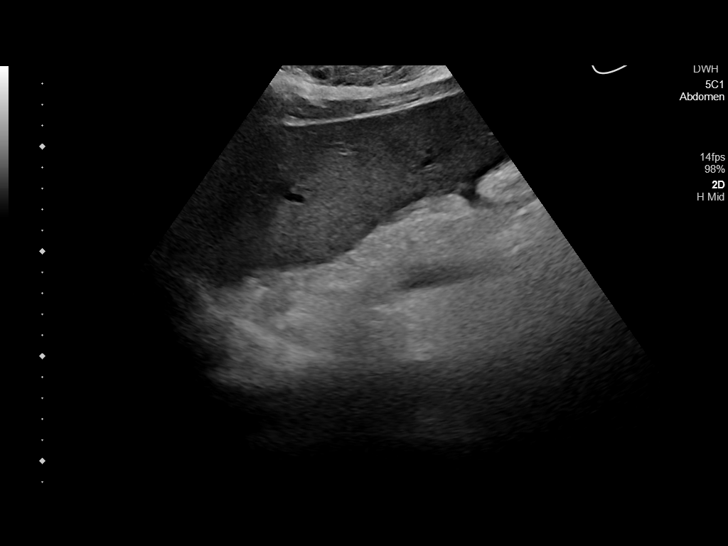
[im 19/49]
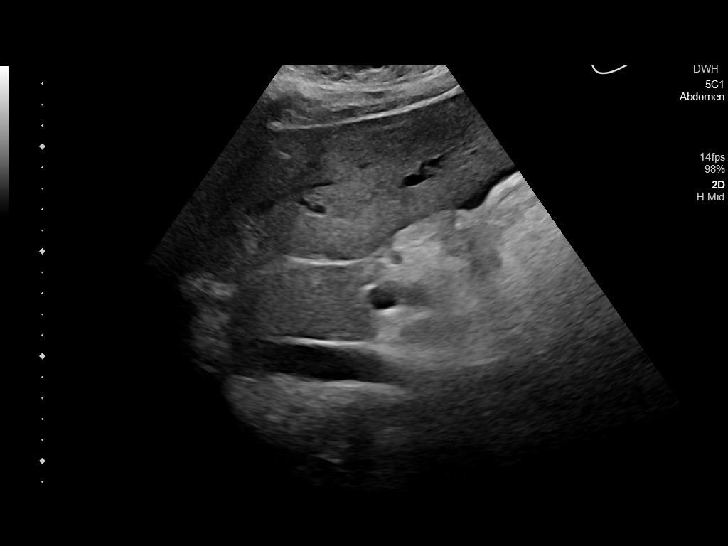
[im 23/49]
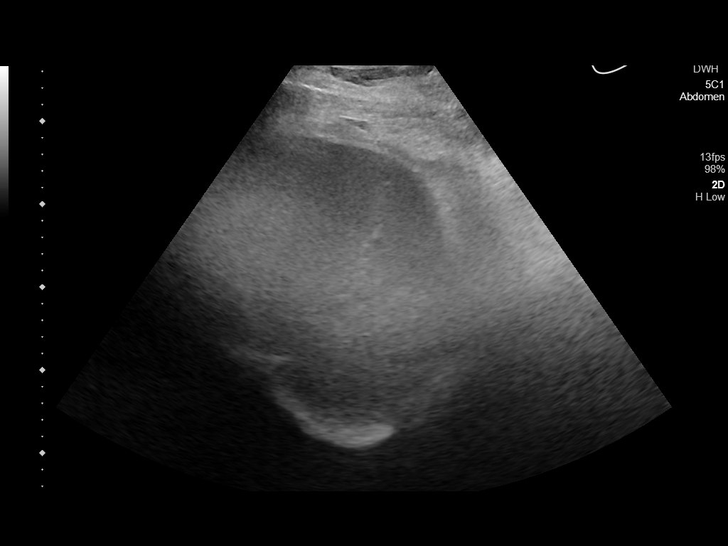
[im 27/49]
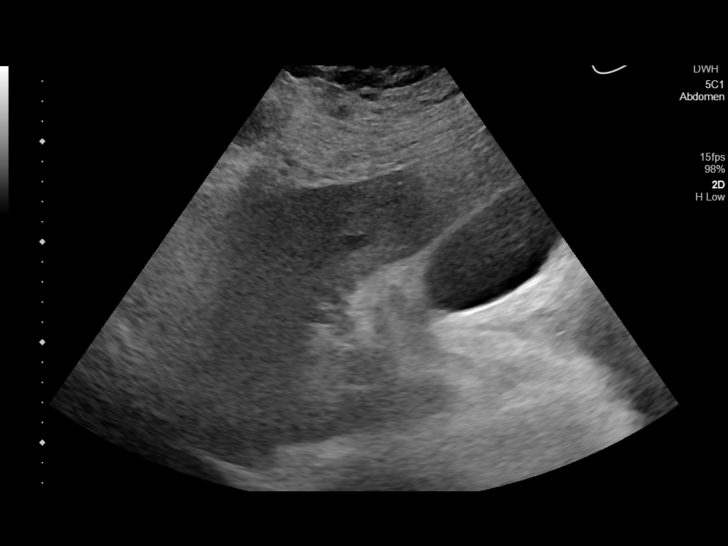
[im 31/49]
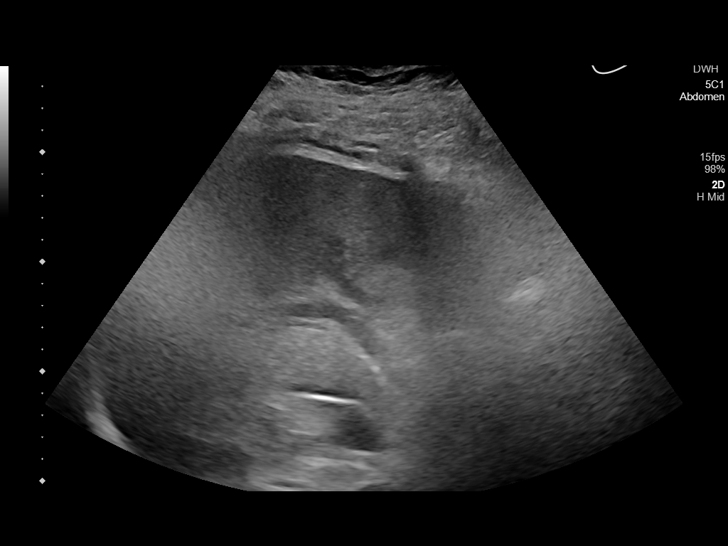
[im 33/49]
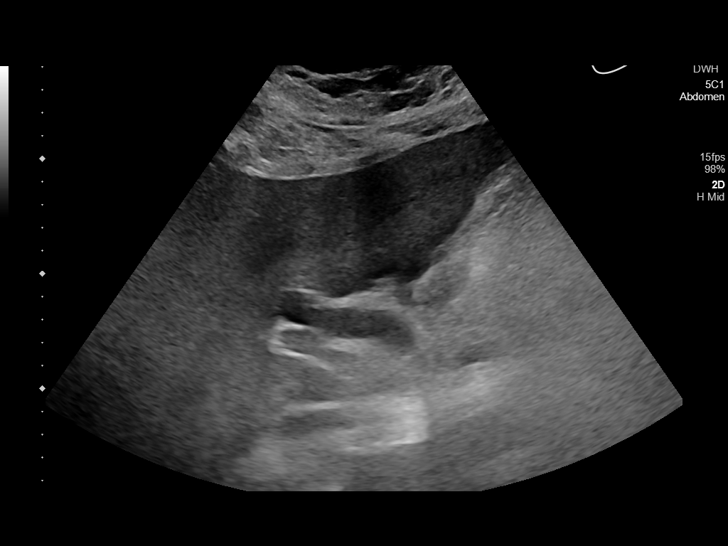
[im 37/49]
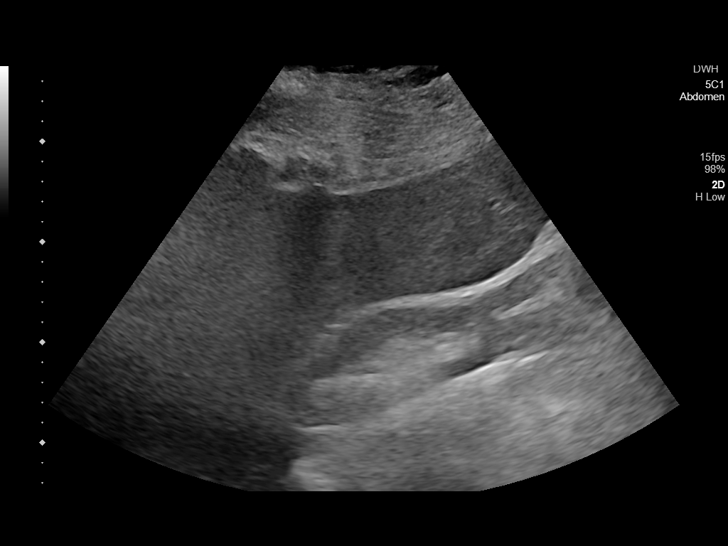
[im 41/49]
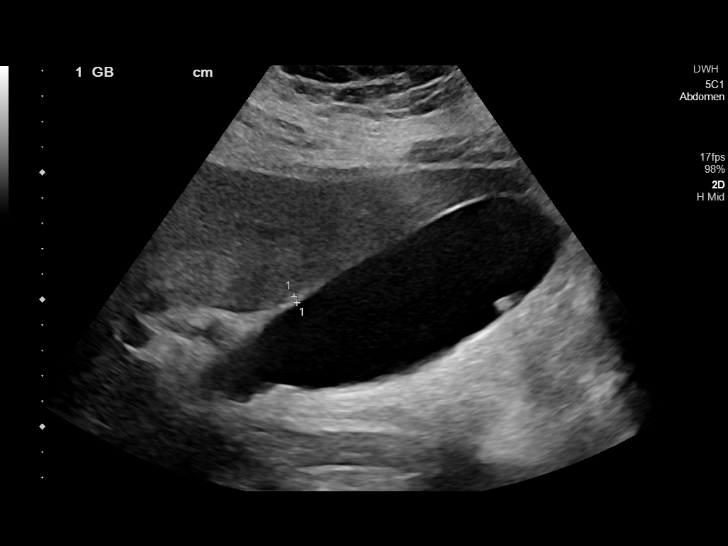
[im 45/49]
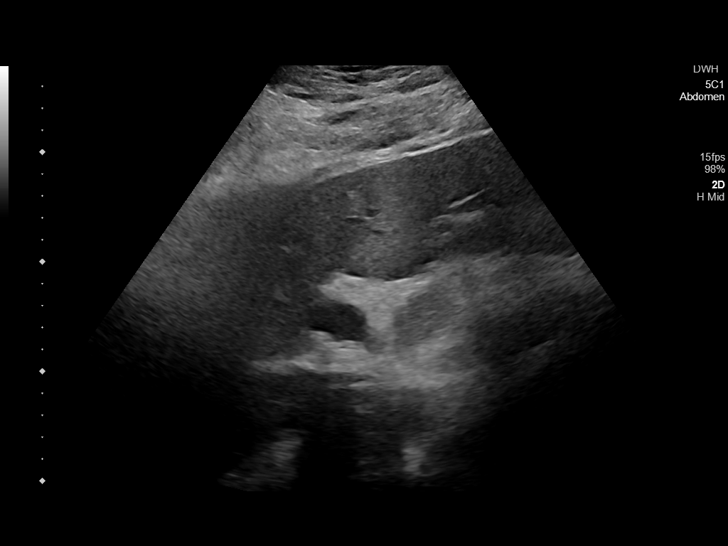
[im 49/49]
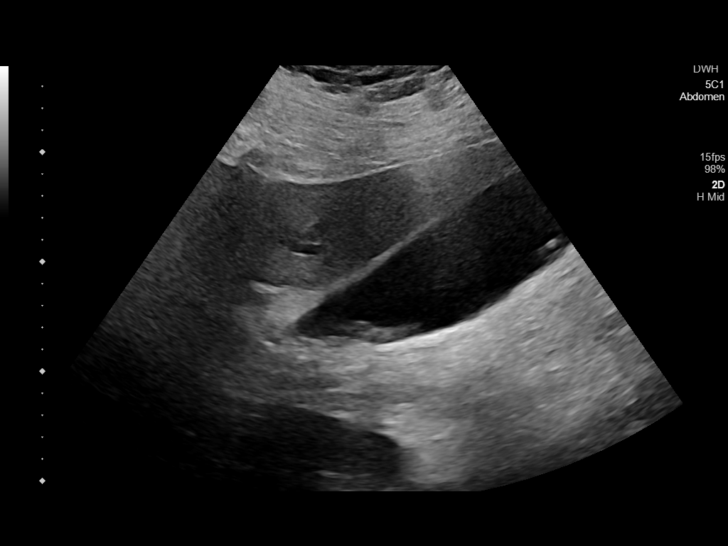

[14 of 25 positions shown; findings below may reference images not displayed]

FINDINGS: Gallbladder:

The gallbladder moderately distended, up to 15.5 cm in length. There
are few small scattered stones up to 8.2 mm.

There is no wall thickening, pericholecystic fluid or positive
sonographic Murphy's sign.

Common bile duct:

Diameter: Moderately dilated measuring 13 mm. There is no
appreciable intrahepatic biliary prominence.

Liver:

No focal lesion identified. Liver is diffusely attenuating and
echogenic consistent with steatosis. Portal vein is patent on color
Doppler imaging with normal direction of blood flow towards the
liver.

Other: None.
IMPRESSION: 1. Dilated gallbladder with stones without wall thickening or
sonographic Murphy's sign.
2. Dilated common bile duct without intrahepatic biliary dilatation.
Laboratory and clinical correlation advised. MRCP may be helpful if
there is clinical concern for biliary obstruction.
3. Echogenic liver consistent with steatosis.

## 2023-05-17 ENCOUNTER — Emergency Department
Admission: EM | Admit: 2023-05-17 | Discharge: 2023-05-17 | Disposition: A | Discharge status: Home / Self Care | Payer: Self-pay | Attending: Emergency Medicine | Admitting: Emergency Medicine

## 2023-05-17 ENCOUNTER — Other Ambulatory Visit: Payer: Self-pay

## 2023-05-17 ENCOUNTER — Emergency Department: Payer: Self-pay

## 2023-05-17 ENCOUNTER — Encounter: Payer: Self-pay | Admitting: Emergency Medicine

## 2023-05-17 DIAGNOSIS — H109 Unspecified conjunctivitis: Secondary | ICD-10-CM

## 2023-05-17 DIAGNOSIS — I129 Hypertensive chronic kidney disease with stage 1 through stage 4 chronic kidney disease, or unspecified chronic kidney disease: Secondary | ICD-10-CM | POA: Insufficient documentation

## 2023-05-17 DIAGNOSIS — I10 Essential (primary) hypertension: Secondary | ICD-10-CM

## 2023-05-17 DIAGNOSIS — H1089 Other conjunctivitis: Secondary | ICD-10-CM | POA: Insufficient documentation

## 2023-05-17 DIAGNOSIS — N189 Chronic kidney disease, unspecified: Secondary | ICD-10-CM | POA: Insufficient documentation

## 2023-05-17 LAB — BASIC METABOLIC PANEL
Anion gap: 11 (ref 5–15)
BUN: 24 mg/dL — ABNORMAL HIGH (ref 6–20)
CO2: 27 mmol/L (ref 22–32)
Calcium: 9.5 mg/dL (ref 8.9–10.3)
Chloride: 97 mmol/L — ABNORMAL LOW (ref 98–111)
Creatinine, Ser: 2.1 mg/dL — ABNORMAL HIGH (ref 0.61–1.24)
GFR, Estimated: 41 mL/min — ABNORMAL LOW (ref >60)
Glucose, Bld: 100 mg/dL — ABNORMAL HIGH (ref 70–99)
Potassium: 2.8 mmol/L — ABNORMAL LOW (ref 3.5–5.1)
Sodium: 135 mmol/L (ref 135–145)

## 2023-05-17 LAB — CBC
HCT: 32 % — ABNORMAL LOW (ref 39.0–52.0)
Hemoglobin: 9.7 g/dL — ABNORMAL LOW (ref 13.0–17.0)
MCH: 23.4 pg — ABNORMAL LOW (ref 26.0–34.0)
MCHC: 30.3 g/dL (ref 30.0–36.0)
MCV: 77.3 fL — ABNORMAL LOW (ref 80.0–100.0)
Platelets: 250 10*3/uL (ref 150–400)
RBC: 4.14 MIL/uL — ABNORMAL LOW (ref 4.22–5.81)
RDW: 16.5 % — ABNORMAL HIGH (ref 11.5–15.5)
WBC: 9.7 10*3/uL (ref 4.0–10.5)
nRBC: 0 % (ref 0.0–0.2)

## 2023-05-17 MED ORDER — LOSARTAN POTASSIUM 50 MG PO TABS
50.0000 mg | ORAL_TABLET | Freq: Once | ORAL | Status: AC
  Administered 2023-05-17: 50 mg via ORAL
  Filled 2023-05-17: qty 1

## 2023-05-17 MED ORDER — AMLODIPINE BESYLATE 5 MG PO TABS
10.0000 mg | ORAL_TABLET | Freq: Once | ORAL | Status: AC
  Administered 2023-05-17: 10 mg via ORAL
  Filled 2023-05-17: qty 2

## 2023-05-17 MED ORDER — POTASSIUM CHLORIDE CRYS ER 20 MEQ PO TBCR: 20.0000 meq | EXTENDED_RELEASE_TABLET | Freq: Two times a day (BID) | ORAL | 0 refills | Status: DC

## 2023-05-17 MED ORDER — PROCHLORPERAZINE EDISYLATE 10 MG/2ML IJ SOLN
5.0000 mg | Freq: Once | INTRAMUSCULAR | Status: AC
  Administered 2023-05-17: 5 mg via INTRAVENOUS
  Filled 2023-05-17: qty 2

## 2023-05-17 MED ORDER — LOSARTAN POTASSIUM-HCTZ 50-12.5 MG PO TABS: 1.0000 | ORAL_TABLET | Freq: Every day | ORAL | 3 refills | Status: DC

## 2023-05-17 MED ORDER — KETOROLAC TROMETHAMINE 15 MG/ML IJ SOLN
15.0000 mg | Freq: Once | INTRAMUSCULAR | Status: AC
  Administered 2023-05-17: 15 mg via INTRAVENOUS
  Filled 2023-05-17: qty 1

## 2023-05-17 MED ORDER — DIPHENHYDRAMINE HCL 50 MG/ML IJ SOLN
25.0000 mg | Freq: Once | INTRAMUSCULAR | Status: AC
  Administered 2023-05-17: 25 mg via INTRAVENOUS
  Filled 2023-05-17: qty 1

## 2023-05-17 MED ORDER — HYDROCHLOROTHIAZIDE 12.5 MG PO TABS
12.5000 mg | ORAL_TABLET | Freq: Once | ORAL | Status: AC
  Administered 2023-05-17: 12.5 mg via ORAL
  Filled 2023-05-17: qty 1

## 2023-05-17 MED ORDER — POTASSIUM CHLORIDE CRYS ER 20 MEQ PO TBCR
40.0000 meq | EXTENDED_RELEASE_TABLET | Freq: Once | ORAL | Status: AC
  Administered 2023-05-17: 40 meq via ORAL
  Filled 2023-05-17: qty 2

## 2023-05-17 MED ORDER — SODIUM CHLORIDE 0.9 % IV BOLUS
1000.0000 mL | Freq: Once | INTRAVENOUS | Status: AC
  Administered 2023-05-17: 1000 mL via INTRAVENOUS

## 2023-05-17 MED ORDER — AMLODIPINE BESYLATE 10 MG PO TABS: ORAL_TABLET | ORAL | 2 refills | Status: DC

## 2023-05-17 MED ORDER — POLYMYXIN B-TRIMETHOPRIM 10000-0.1 UNIT/ML-% OP SOLN
2.0000 [drp] | Freq: Four times a day (QID) | OPHTHALMIC | 0 refills | Status: AC
Start: 2023-05-17 — End: 2023-05-24

## 2023-05-17 MED ORDER — METOPROLOL SUCCINATE ER 50 MG PO TB24: ORAL_TABLET | ORAL | 2 refills | Status: DC

## 2023-05-17 MED ORDER — FLUORESCEIN SODIUM 1 MG OP STRP
1.0000 | ORAL_STRIP | Freq: Once | OPHTHALMIC | Status: AC
  Administered 2023-05-17: 1 via OPHTHALMIC
  Filled 2023-05-17: qty 1

## 2023-05-17 MED ORDER — TETRACAINE HCL 0.5 % OP SOLN
2.0000 [drp] | Freq: Once | OPHTHALMIC | Status: AC
  Administered 2023-05-17: 2 [drp] via OPHTHALMIC
  Filled 2023-05-17: qty 4

## 2023-05-17 NOTE — Discharge Instructions (Addendum)
 I have sent antibiotics to your pharmacy to treat the eye infection in your left eye.  Please take these drops as prescribed for the full course.  I have also refilled your blood pressure medications and wish for you to take these as prescribed.  I have given you follow-up with a primary care provider as well as a cardiologist for ongoing management of your uncontrolled hypertension.  Please follow-up with them soon as possible.  Lastly, I have sent potassium supplement pills for you to take as your potassium was low today.  Please follow-up as soon as you can with these doctors for ongoing outpatient management.  Please return for any severe worsening symptoms.  Thank you for the opportunity to take care of you in our Emergency Department. You have been diagnosed with high blood pressure, also known as hypertension. This means that the force of blood against the walls of your blood vessels called is too strong. It also means that your heart has to work harder to move the blood. High blood pressure usually has no symptoms, but over time, it can cause serious health problems such as Heart attack and heart failure Stroke Kidney disease and failure Vision loss With the help from your healthcare provider and some important life style changes, you can manage your blood pressure and protect your health. Please read the instructions provided on hypertension, how to manage it and how to check your blood pressure. Additionally, use the blood pressure log provided to record your blood pressures. Take the blood pressure log with you to your primary care doctor so that they can adjust your blood pressure medications if needed. Please read the instructions on follow-up appointment. Return to the ER or Call 911 right away if you have any of these symptoms: Chest pain or shortness of breath Severe headache Weakness, tingling, or numbness of your face, arms, or legs (especially on 1 side of the body) Sudden change in  vision Confusion, trouble speaking, or trouble understanding speech

## 2023-05-17 NOTE — ED Notes (Signed)
 See triage notes. Patient c/o left eye pain for the past two weeks. Patient has history of hypertension and stated that he last took his medication sometime around Thanksgiving of last year. Patient c/o light sensitivity

## 2023-05-17 NOTE — ED Provider Notes (Signed)
 [facility] Provider Note    Event Date/Time   First MD Initiated Contact with Patient 05/17/23 1016     (approximate)   History   Eye Pain and Hypertension   HPI Darrell Campbell is a 33 y.o. male with history of HTN, CKD, morbid obesity presenting today for high blood pressure and left eye pain.  Patient states for the past 2 weeks he has had intermittent pain in his left eye as well as left-sided headache.  He denies any obvious changes to his vision or trauma to the eye.  Has noted redness to the eye but no drainage.  Otherwise denies any swelling around the eye or pain with movement of his eye.  Has not taken any medication for it.  Separately was found to be hypertensive on arrival and he states he has been out of his high blood pressure medications for a long time.  Otherwise denies any chest pain, numbness, tingling, nausea, vomiting, abdominal pain, difficulty breathing.     Physical Exam   Triage Vital Signs: ED Triage Vitals  Encounter Vitals Group     BP 05/17/23 1000 (!) 206/140     Systolic BP Percentile --      Diastolic BP Percentile --      Pulse Rate 05/17/23 1000 90     Resp 05/17/23 1000 20     Temp 05/17/23 1000 98.5 F (36.9 C)     Temp Source 05/17/23 1000 Oral     SpO2 05/17/23 1000 100 %     Weight 05/17/23 0955 (!) 340 lb (154.2 kg)     Height 05/17/23 0955 6\' 1"  (1.854 m)     Head Circumference --      Peak Flow --      Pain Score 05/17/23 1001 7     Pain Loc --      Pain Education --      Exclude from Growth Chart --     Most recent vital signs: Vitals:   05/17/23 1230 05/17/23 1242  BP: (!) 207/112 (!) 207/112  Pulse: 73 73  Resp:  20  Temp:  98.5 F (36.9 C)  SpO2: (!) 88% 96%   Physical Exam: I have reviewed the vital signs and nursing notes. General: Awake, alert, no acute distress.  Nontoxic appearing. Obese Head:  Atraumatic, normocephalic.   ENT:  EOM intact, PERRL. Oral mucosa is pink and moist with  no lesions.  Conjunctival injection of the left eye without any obvious drainage.  Extraocular motion intact without pain with movement.  Vision 20/50 in the left eye and nondetectable in the right eye which he states is his baseline.  No swelling around the eye itself or erythema around the eye Neck: Neck is supple with full range of motion, No meningeal signs. Cardiovascular:  RRR, No murmurs. Peripheral pulses palpable and equal bilaterally. Respiratory:  Symmetrical chest wall expansion.  No rhonchi, rales, or wheezes.  Good air movement throughout.  No use of accessory muscles.   Musculoskeletal:  No cyanosis or edema. Moving extremities with full ROM Abdomen:  Soft, nontender, nondistended. Neuro:  GCS 15, moving all four extremities, interacting appropriately. Speech clear. Psych:  Calm, appropriate.   Skin:  Warm, dry, no rash.    ED Results / Procedures / Treatments   Labs (all labs ordered are listed, but only abnormal results are displayed) Labs Reviewed  CBC - Abnormal; Notable for the following components:  Result Value   RBC 4.14 (*)    Hemoglobin 9.7 (*)    HCT 32.0 (*)    MCV 77.3 (*)    MCH 23.4 (*)    RDW 16.5 (*)    All other components within normal limits  BASIC METABOLIC PANEL - Abnormal; Notable for the following components:   Potassium 2.8 (*)    Chloride 97 (*)    Glucose, Bld 100 (*)    BUN 24 (*)    Creatinine, Ser 2.10 (*)    GFR, Estimated 41 (*)    All other components within normal limits     EKG    RADIOLOGY Independently interpreted CT head with no acute pathology   PROCEDURES:  Critical Care performed: No  Procedures   MEDICATIONS ORDERED IN ED: Medications  sodium chloride 0.9 % bolus 1,000 mL (0 mLs Intravenous Stopped 05/17/23 1255)  tetracaine (PONTOCAINE) 0.5 % ophthalmic solution 2 drop (2 drops Left Eye Given 05/17/23 1148)  potassium chloride SA (KLOR-CON M) CR tablet 40 mEq (40 mEq Oral Given 05/17/23 1104)  amLODipine  (NORVASC) tablet 10 mg (10 mg Oral Given 05/17/23 1104)  losartan (COZAAR) tablet 50 mg (50 mg Oral Given 05/17/23 1148)  hydrochlorothiazide (HYDRODIURIL) tablet 12.5 mg (12.5 mg Oral Given 05/17/23 1148)  fluorescein ophthalmic strip 1 strip (1 strip Left Eye Given 05/17/23 1148)  ketorolac (TORADOL) 15 MG/ML injection 15 mg (15 mg Intravenous Given 05/17/23 1131)  prochlorperazine (COMPAZINE) injection 5 mg (5 mg Intravenous Given 05/17/23 1132)  diphenhydrAMINE (BENADRYL) injection 25 mg (25 mg Intravenous Given 05/17/23 1130)     IMPRESSION / MDM / ASSESSMENT AND PLAN / ED COURSE  I reviewed the triage vital signs and the nursing notes.                              Differential diagnosis includes, but is not limited to, bacterial conjunctivitis, acute angle-closure glaucoma, corneal abrasion, uncontrolled hypertension, ICH  Patient's presentation is most consistent with acute complicated illness / injury requiring diagnostic workup.  Patient is a 33 year old male presenting primarily today for pain in his left eye that has been present for about 2 weeks.  There is no swelling around the eye and he has no significant pain with extraocular motion.  No recent trauma to the eye.  His vision in his left eye is 20/50 which she states is baseline.  Bedside intraocular pressure testing with 15, 17, 18 with no concern for acute angle-closure glaucoma.  Patient was hypertensive and so he was given CT head which was negative for acute intracranial pathology.  Treated with migraine cocktail which resulted in symptomatic resolution of his headache.  Will start him on eyedrops to treat potential bacterial conjunctivitis in the left eye.  Fluorescein staining with no evidence of abrasion.  Separately, patient does have anemia at 9.7 but has prior history of iron deficiency and takes pills for this.  No other evidence of bleeding.  He was also found to have mild AKI from his baseline CKD as well as mild hypokalemia.   Given 1 L of fluids and orally repleted with potassium.  He was also given his prior blood pressure medications while here.  Patient has uncontrolled hypertension and on top of his eyedrops, will prescribe his prior medications that he was most recently on including the amlodipine, metoprolol, and Hyzaar.  Also given potassium supplementation for the next several days.  Patient was given  referral to follow-up with a primary care provider as well as cardiologist for help with his uncontrolled hypertension.  Given strict return precautions for any worsening symptoms which she was agreeable with.  The patient is on the cardiac monitor to evaluate for evidence of arrhythmia and/or significant heart rate changes.     FINAL CLINICAL IMPRESSION(S) / ED DIAGNOSES   Final diagnoses:  Bacterial conjunctivitis of left eye  Uncontrolled hypertension     Rx / DC Orders   ED Discharge Orders          Ordered    trimethoprim-polymyxin b (POLYTRIM) ophthalmic solution  Every 6 hours        05/17/23 1246    amLODipine (NORVASC) 10 MG tablet        05/17/23 1246    losartan-hydrochlorothiazide (HYZAAR) 50-12.5 MG tablet  Daily        05/17/23 1246    metoprolol succinate (TOPROL-XL) 50 MG 24 hr tablet        05/17/23 1246    potassium chloride SA (KLOR-CON M) 20 MEQ tablet  2 times daily        05/17/23 1246    Ambulatory Referral to Primary Care (Establish Care)        05/17/23 1246    Ambulatory referral to Cardiology       Comments: Severe uncontrolled hypertension not currently taking medications as prescribed.   05/17/23 1246             Note:  This document was prepared using Dragon voice recognition software and may include unintentional dictation errors.   Janith Lima, MD 05/17/23 [phone]

## 2023-05-17 NOTE — ED Triage Notes (Signed)
 Pt via POV from home. Pt c/o L eye pain, states it sharp pain behind his L eye and radiates to the L side of his head. Pt has a hx of HTN and reports that he has been out of his medications for a couple months. States that light makes the pain worse. BP on arrival 206/140. Pt is A&Ox4 and NAD, ambulatory to triage.

## 2023-05-17 NOTE — ED Notes (Signed)
 L 20/50   R - 0

## 2024-03-06 ENCOUNTER — Inpatient Hospital Stay
Admit: 2024-03-06 | Discharge: 2024-03-06 | Disposition: A | Discharge status: Home / Self Care | Payer: Self-pay | Attending: Internal Medicine | Admitting: Internal Medicine

## 2024-03-06 ENCOUNTER — Other Ambulatory Visit: Payer: Self-pay

## 2024-03-06 ENCOUNTER — Inpatient Hospital Stay: Payer: Self-pay

## 2024-03-06 ENCOUNTER — Inpatient Hospital Stay
Admission: EM | Admit: 2024-03-06 | Discharge: 2024-03-16 | DRG: 189 | Disposition: A | Discharge status: Home / Self Care | Payer: Self-pay | Attending: Internal Medicine | Admitting: Internal Medicine

## 2024-03-06 ENCOUNTER — Emergency Department: Payer: Self-pay

## 2024-03-06 DIAGNOSIS — I890 Lymphedema, not elsewhere classified: Secondary | ICD-10-CM | POA: Diagnosis present

## 2024-03-06 DIAGNOSIS — N179 Acute kidney failure, unspecified: Secondary | ICD-10-CM | POA: Diagnosis present

## 2024-03-06 DIAGNOSIS — Z1152 Encounter for screening for COVID-19: Secondary | ICD-10-CM

## 2024-03-06 DIAGNOSIS — K760 Fatty (change of) liver, not elsewhere classified: Secondary | ICD-10-CM | POA: Diagnosis present

## 2024-03-06 DIAGNOSIS — I878 Other specified disorders of veins: Secondary | ICD-10-CM | POA: Diagnosis present

## 2024-03-06 DIAGNOSIS — I10 Essential (primary) hypertension: Secondary | ICD-10-CM | POA: Diagnosis present

## 2024-03-06 DIAGNOSIS — R531 Weakness: Secondary | ICD-10-CM

## 2024-03-06 DIAGNOSIS — I130 Hypertensive heart and chronic kidney disease with heart failure and stage 1 through stage 4 chronic kidney disease, or unspecified chronic kidney disease: Secondary | ICD-10-CM | POA: Diagnosis present

## 2024-03-06 DIAGNOSIS — Z79899 Other long term (current) drug therapy: Secondary | ICD-10-CM

## 2024-03-06 DIAGNOSIS — E559 Vitamin D deficiency, unspecified: Secondary | ICD-10-CM | POA: Diagnosis present

## 2024-03-06 DIAGNOSIS — I509 Heart failure, unspecified: Secondary | ICD-10-CM

## 2024-03-06 DIAGNOSIS — I480 Paroxysmal atrial fibrillation: Secondary | ICD-10-CM | POA: Diagnosis present

## 2024-03-06 DIAGNOSIS — I5033 Acute on chronic diastolic (congestive) heart failure: Secondary | ICD-10-CM | POA: Diagnosis present

## 2024-03-06 DIAGNOSIS — R7303 Prediabetes: Secondary | ICD-10-CM | POA: Diagnosis present

## 2024-03-06 DIAGNOSIS — D509 Iron deficiency anemia, unspecified: Secondary | ICD-10-CM | POA: Diagnosis present

## 2024-03-06 DIAGNOSIS — N1832 Chronic kidney disease, stage 3b: Secondary | ICD-10-CM | POA: Diagnosis present

## 2024-03-06 DIAGNOSIS — J069 Acute upper respiratory infection, unspecified: Secondary | ICD-10-CM

## 2024-03-06 DIAGNOSIS — E66813 Obesity, class 3: Secondary | ICD-10-CM | POA: Diagnosis present

## 2024-03-06 DIAGNOSIS — J9611 Chronic respiratory failure with hypoxia: Secondary | ICD-10-CM

## 2024-03-06 DIAGNOSIS — R0602 Shortness of breath: Principal | ICD-10-CM

## 2024-03-06 DIAGNOSIS — Z8249 Family history of ischemic heart disease and other diseases of the circulatory system: Secondary | ICD-10-CM

## 2024-03-06 DIAGNOSIS — Z6841 Body Mass Index (BMI) 40.0 and over, adult: Secondary | ICD-10-CM

## 2024-03-06 DIAGNOSIS — Z91030 Bee allergy status: Secondary | ICD-10-CM

## 2024-03-06 DIAGNOSIS — J9601 Acute respiratory failure with hypoxia: Principal | ICD-10-CM | POA: Diagnosis present

## 2024-03-06 DIAGNOSIS — E876 Hypokalemia: Secondary | ICD-10-CM | POA: Diagnosis present

## 2024-03-06 DIAGNOSIS — M109 Gout, unspecified: Secondary | ICD-10-CM | POA: Diagnosis present

## 2024-03-06 DIAGNOSIS — Z833 Family history of diabetes mellitus: Secondary | ICD-10-CM

## 2024-03-06 DIAGNOSIS — Z8616 Personal history of COVID-19: Secondary | ICD-10-CM

## 2024-03-06 DIAGNOSIS — G4733 Obstructive sleep apnea (adult) (pediatric): Secondary | ICD-10-CM | POA: Diagnosis present

## 2024-03-06 DIAGNOSIS — D631 Anemia in chronic kidney disease: Secondary | ICD-10-CM | POA: Diagnosis present

## 2024-03-06 DIAGNOSIS — K567 Ileus, unspecified: Secondary | ICD-10-CM | POA: Diagnosis not present

## 2024-03-06 LAB — CBC
HCT: 30.5 % — ABNORMAL LOW (ref 39.0–52.0)
Hemoglobin: 9.4 g/dL — ABNORMAL LOW (ref 13.0–17.0)
MCH: 23.1 pg — ABNORMAL LOW (ref 26.0–34.0)
MCHC: 30.8 g/dL (ref 30.0–36.0)
MCV: 74.9 fL — ABNORMAL LOW (ref 80.0–100.0)
Platelets: 189 K/uL (ref 150–400)
RBC: 4.07 MIL/uL — ABNORMAL LOW (ref 4.22–5.81)
RDW: 17.2 % — ABNORMAL HIGH (ref 11.5–15.5)
WBC: 19.2 K/uL — ABNORMAL HIGH (ref 4.0–10.5)
nRBC: 0 % (ref 0.0–0.2)

## 2024-03-06 LAB — BASIC METABOLIC PANEL WITH GFR
Anion gap: 14 (ref 5–15)
BUN: 43 mg/dL — ABNORMAL HIGH (ref 6–20)
CO2: 24 mmol/L (ref 22–32)
Calcium: 8.7 mg/dL — ABNORMAL LOW (ref 8.9–10.3)
Chloride: 101 mmol/L (ref 98–111)
Creatinine, Ser: 4.73 mg/dL — ABNORMAL HIGH (ref 0.61–1.24)
GFR, Estimated: 16 mL/min — ABNORMAL LOW
Glucose, Bld: 115 mg/dL — ABNORMAL HIGH (ref 70–99)
Potassium: 3.1 mmol/L — ABNORMAL LOW (ref 3.5–5.1)
Sodium: 139 mmol/L (ref 135–145)

## 2024-03-06 LAB — PRO BRAIN NATRIURETIC PEPTIDE: Pro Brain Natriuretic Peptide: 3831 pg/mL — ABNORMAL HIGH

## 2024-03-06 LAB — COMPREHENSIVE METABOLIC PANEL WITH GFR
ALT: 25 U/L (ref 0–44)
AST: 60 U/L — ABNORMAL HIGH (ref 15–41)
Albumin: 3.5 g/dL (ref 3.5–5.0)
Alkaline Phosphatase: 78 U/L (ref 38–126)
Anion gap: 15 (ref 5–15)
BUN: 40 mg/dL — ABNORMAL HIGH (ref 6–20)
CO2: 24 mmol/L (ref 22–32)
Calcium: 9 mg/dL (ref 8.9–10.3)
Chloride: 101 mmol/L (ref 98–111)
Creatinine, Ser: 4.26 mg/dL — ABNORMAL HIGH (ref 0.61–1.24)
GFR, Estimated: 18 mL/min — ABNORMAL LOW
Glucose, Bld: 126 mg/dL — ABNORMAL HIGH (ref 70–99)
Potassium: 3.1 mmol/L — ABNORMAL LOW (ref 3.5–5.1)
Sodium: 140 mmol/L (ref 135–145)
Total Bilirubin: 0.7 mg/dL (ref 0.0–1.2)
Total Protein: 8.1 g/dL (ref 6.5–8.1)

## 2024-03-06 LAB — RESP PANEL BY RT-PCR (RSV, FLU A&B, COVID)  RVPGX2
Influenza A by PCR: NEGATIVE
Influenza B by PCR: NEGATIVE
Resp Syncytial Virus by PCR: NEGATIVE
SARS Coronavirus 2 by RT PCR: NEGATIVE

## 2024-03-06 LAB — TROPONIN T, HIGH SENSITIVITY
Troponin T High Sensitivity: 134 ng/L (ref 0–19)
Troponin T High Sensitivity: 136 ng/L (ref 0–19)
Troponin T High Sensitivity: 136 ng/L (ref 0–19)

## 2024-03-06 LAB — LACTIC ACID, PLASMA: Lactic Acid, Venous: 1.6 mmol/L (ref 0.5–1.9)

## 2024-03-06 LAB — D-DIMER, QUANTITATIVE: D-Dimer, Quant: 5.59 ug{FEU}/mL — ABNORMAL HIGH (ref 0.00–0.50)

## 2024-03-06 LAB — MAGNESIUM: Magnesium: 1.5 mg/dL — ABNORMAL LOW (ref 1.7–2.4)

## 2024-03-06 LAB — TSH: TSH: 1.94 u[IU]/mL (ref 0.350–4.500)

## 2024-03-06 MED ORDER — ONDANSETRON HCL 4 MG/2ML IJ SOLN
4.0000 mg | Freq: Four times a day (QID) | INTRAMUSCULAR | Status: DC | PRN
  Administered 2024-03-09 – 2024-03-12 (×4): 4 mg via INTRAVENOUS
  Filled 2024-03-06 (×2): qty 2

## 2024-03-06 MED ORDER — IPRATROPIUM-ALBUTEROL 0.5-2.5 (3) MG/3ML IN SOLN
3.0000 mL | Freq: Once | RESPIRATORY_TRACT | Status: AC
  Administered 2024-03-06: 3 mL via RESPIRATORY_TRACT
  Filled 2024-03-06: qty 3

## 2024-03-06 MED ORDER — AMLODIPINE BESYLATE 5 MG PO TABS
5.0000 mg | ORAL_TABLET | Freq: Every day | ORAL | Status: DC
  Administered 2024-03-07 – 2024-03-11 (×4): 5 mg via ORAL
  Filled 2024-03-06 (×3): qty 1

## 2024-03-06 MED ORDER — SODIUM CHLORIDE 0.9 % IV SOLN
2.0000 g | INTRAVENOUS | Status: AC
  Administered 2024-03-07 – 2024-03-10 (×5): 2 g via INTRAVENOUS
  Filled 2024-03-06 (×4): qty 20

## 2024-03-06 MED ORDER — DILTIAZEM HCL 25 MG/5ML IV SOLN: 20.0000 mg | Freq: Once | INTRAVENOUS | Status: DC

## 2024-03-06 MED ORDER — ONDANSETRON HCL 4 MG PO TABS: 4.0000 mg | ORAL_TABLET | Freq: Four times a day (QID) | ORAL | Status: DC | PRN

## 2024-03-06 MED ORDER — SODIUM CHLORIDE 0.9 % IV SOLN
500.0000 mg | INTRAVENOUS | Status: DC
  Administered 2024-03-07 – 2024-03-09 (×3): 500 mg via INTRAVENOUS
  Filled 2024-03-06 (×3): qty 5

## 2024-03-06 MED ORDER — PERFLUTREN LIPID MICROSPHERE
1.0000 mL | INTRAVENOUS | Status: AC | PRN
  Administered 2024-03-06: 10 mL via INTRAVENOUS

## 2024-03-06 MED ORDER — SENNOSIDES-DOCUSATE SODIUM 8.6-50 MG PO TABS: 1.0000 | ORAL_TABLET | Freq: Every evening | ORAL | Status: DC | PRN

## 2024-03-06 MED ORDER — LACTATED RINGERS IV SOLN: INTRAVENOUS | Status: AC

## 2024-03-06 MED ORDER — POTASSIUM CHLORIDE 20 MEQ PO PACK
60.0000 meq | PACK | Freq: Once | ORAL | Status: AC
  Administered 2024-03-06: 60 meq via ORAL
  Filled 2024-03-06: qty 3

## 2024-03-06 MED ORDER — ACETAMINOPHEN 650 MG RE SUPP: 650.0000 mg | Freq: Four times a day (QID) | RECTAL | Status: DC | PRN

## 2024-03-06 MED ORDER — IPRATROPIUM-ALBUTEROL 0.5-2.5 (3) MG/3ML IN SOLN: 3.0000 mL | Freq: Four times a day (QID) | RESPIRATORY_TRACT | Status: DC | PRN

## 2024-03-06 MED ORDER — POTASSIUM CHLORIDE 20 MEQ PO PACK
60.0000 meq | PACK | Freq: Once | ORAL | Status: AC
  Administered 2024-03-07: 60 meq via ORAL
  Filled 2024-03-06: qty 3

## 2024-03-06 MED ORDER — FUROSEMIDE 10 MG/ML IJ SOLN
80.0000 mg | Freq: Once | INTRAMUSCULAR | Status: AC
  Administered 2024-03-06: 80 mg via INTRAVENOUS
  Filled 2024-03-06: qty 8

## 2024-03-06 MED ORDER — SODIUM CHLORIDE 0.9% FLUSH
3.0000 mL | Freq: Two times a day (BID) | INTRAVENOUS | Status: DC
  Administered 2024-03-06 – 2024-03-15 (×17): 3 mL via INTRAVENOUS

## 2024-03-06 MED ORDER — ACETAMINOPHEN 325 MG PO TABS
650.0000 mg | ORAL_TABLET | Freq: Four times a day (QID) | ORAL | Status: DC | PRN
  Administered 2024-03-07 – 2024-03-13 (×4): 650 mg via ORAL
  Filled 2024-03-06 (×2): qty 2

## 2024-03-06 MED ORDER — HEPARIN SODIUM (PORCINE) 5000 UNIT/ML IJ SOLN
5000.0000 [IU] | Freq: Three times a day (TID) | INTRAMUSCULAR | Status: DC
  Administered 2024-03-06 – 2024-03-16 (×30): 5000 [IU] via SUBCUTANEOUS
  Filled 2024-03-06 (×11): qty 1

## 2024-03-06 NOTE — ED Triage Notes (Signed)
 Pt arrived from home via EMS d/t flu-like symptoms x3 days. EMS also reports swelling in pt's left leg. Pt on 6LNC upon arrival. Pt AO x4. Pt breathing heavily upon arrival.

## 2024-03-06 NOTE — ED Provider Notes (Signed)
 "  [facility] Provider Note    Event Date/Time   First MD Initiated Contact with Patient 03/06/24 [phone]     (approximate)   History   Shortness of Breath   HPI  Darrell Campbell is a 33 y.o. male with history of hypertension, morbid obesity, prediabetes, venous stasis of both lower extremities, lymphedema primarily in the left who presents with shortness of breath, flulike symptoms.  Patient reports 2 days ago he started having bodyaches fatigue chills mild cough.  Shortness of breath has worsened over the last 2 days.  No history of asthma.     Physical Exam   Triage Vital Signs: ED Triage Vitals [03/06/24 0919]  Encounter Vitals Group     BP      Girls Systolic BP Percentile      Girls Diastolic BP Percentile      Boys Systolic BP Percentile      Boys Diastolic BP Percentile      Pulse      Resp      Temp      Temp src      SpO2 98 %     Weight      Height      Head Circumference      Peak Flow      Pain Score      Pain Loc      Pain Education      Exclude from Growth Chart     Most recent vital signs: Vitals:   03/06/24 1200 03/06/24 1211  BP: 123/75 117/74  Pulse: (!) 104 (!) 102  Resp: (!) 38 (!) 40  Temp:  (!) 101.9 F (38.8 C)  SpO2:  100%     General: Awake,  CV:  Good peripheral perfusion.  Resp:  Mild tachypnea, scattered minimal wheezing Abd:  No distention.  Other:  Lymphedema to the left leg   ED Results / Procedures / Treatments   Labs (all labs ordered are listed, but only abnormal results are displayed) Labs Reviewed  CBC - Abnormal; Notable for the following components:      Result Value   WBC 19.2 (*)    RBC 4.07 (*)    Hemoglobin 9.4 (*)    HCT 30.5 (*)    MCV 74.9 (*)    MCH 23.1 (*)    RDW 17.2 (*)    All other components within normal limits  COMPREHENSIVE METABOLIC PANEL WITH GFR - Abnormal; Notable for the following components:   Potassium 3.1 (*)    Glucose, Bld 126 (*)    BUN 40 (*)     Creatinine, Ser 4.26 (*)    AST 60 (*)    GFR, Estimated 18 (*)    All other components within normal limits  PRO BRAIN NATRIURETIC PEPTIDE - Abnormal; Notable for the following components:   Pro Brain Natriuretic Peptide 3,831.0 (*)    All other components within normal limits  D-DIMER, QUANTITATIVE (NOT AT [facility]) - Abnormal; Notable for the following components:   D-Dimer, Quant 5.59 (*)    All other components within normal limits  TROPONIN T, HIGH SENSITIVITY - Abnormal; Notable for the following components:   Troponin T High Sensitivity 134 (*)    All other components within normal limits  RESP PANEL BY RT-PCR (RSV, FLU A&B, COVID)  RVPGX2  CULTURE, BLOOD (ROUTINE X 2)  CULTURE, BLOOD (ROUTINE X 2)  LACTIC ACID, PLASMA  HIV ANTIBODY (ROUTINE TESTING W REFLEX)  MAGNESIUM   TSH  BASIC METABOLIC PANEL WITH GFR  TROPONIN T, HIGH SENSITIVITY  TROPONIN T, HIGH SENSITIVITY     EKG  ED ECG REPORT I, Lamar Price, the attending physician, personally viewed and interpreted this ECG.  Date: 03/06/2024  Rhythm: Atrial fibrillation with RVR QRS Axis: normal Intervals: Abnormal ST/T Wave abnormalities: normal Narrative Interpretation: Atrial fibrillation with RVR    RADIOLOGY Chest x-ray demonstrates cardiomegaly    PROCEDURES:  Critical Care performed: yes  CRITICAL CARE Performed by: Lamar Price   Total critical care time: 30 minutes  Critical care time was exclusive of separately billable procedures and treating other patients.  Critical care was necessary to treat or prevent imminent or life-threatening deterioration.  Critical care was time spent personally by me on the following activities: development of treatment plan with patient and/or surrogate as well as nursing, discussions with consultants, evaluation of patient's response to treatment, examination of patient, obtaining history from patient or surrogate, ordering and performing treatments and  interventions, ordering and review of laboratory studies, ordering and review of radiographic studies, pulse oximetry and re-evaluation of patient's condition.   Procedures   MEDICATIONS ORDERED IN ED: Medications  sodium chloride  flush (NS) 0.9 % injection 3 mL (has no administration in time range)  acetaminophen  (TYLENOL ) tablet 650 mg (has no administration in time range)    Or  acetaminophen  (TYLENOL ) suppository 650 mg (has no administration in time range)  senna-docusate (Senokot-S) tablet 1 tablet (has no administration in time range)  heparin  injection 5,000 Units (has no administration in time range)  ondansetron  (ZOFRAN ) tablet 4 mg (has no administration in time range)    Or  ondansetron  (ZOFRAN ) injection 4 mg (has no administration in time range)  ipratropium-albuterol  (DUONEB) 0.5-2.5 (3) MG/3ML nebulizer solution 3 mL (has no administration in time range)  potassium chloride  (KLOR-CON ) packet 60 mEq (has no administration in time range)  ipratropium-albuterol  (DUONEB) 0.5-2.5 (3) MG/3ML nebulizer solution 3 mL (3 mLs Nebulization Given 03/06/24 0946)  furosemide  (LASIX ) injection 80 mg (80 mg Intravenous Given 03/06/24 1205)  ipratropium-albuterol  (DUONEB) 0.5-2.5 (3) MG/3ML nebulizer solution 3 mL (3 mLs Nebulization Given 03/06/24 1219)     IMPRESSION / MDM / ASSESSMENT AND PLAN / ED COURSE  I reviewed the triage vital signs and the nursing notes. Patient's presentation is most consistent with acute presentation with potential threat to life or bodily function.  Patient presents with shortness of breath, likely viral symptoms as detailed above, differential includes pneumonia, influenza, COVID.  EMS placed the patient on 6 L nasal cannula for hypoxia  We were able to back him down to 2 to 3 L here.  Pending labs including chest x-ray  EKG is consistent with atrial fibrillation with RVR with a heart rate of 145, blood pressure is stable, will give IV Cardizem  20 mg  slow push  He does not have a diagnosis of atrial fibrillation before  ----------------------------------------- 10:14 AM on 03/06/2024 ----------------------------------------- Patient's heart rate has improved to 107 and looks regular, patient has not received Cardizem  yet will recheck EKG I suspect he has gone back into a normal sinus rhythm  ----------------------------------------- 10:35 AM on 03/06/2024 ----------------------------------------- ED ECG REPORT I, Lamar Price, the attending physician, personally viewed and interpreted this ECG.  Date: 03/06/2024  Rhythm: Sinus tachycardia QRS Axis: normal Intervals: normal ST/T Wave abnormalities: normal Narrative Interpretation: no evidence of acute ischemia ----------------------------------------- 12:18 PM on 03/06/2024 ----------------------------------------- Lab work is notable for significantly elevated BNP, elevated troponin which I  think is related to increased work of breathing.  He also has an elevated creatinine consistent with acute kidney injury.  He has no chest pain.  I suspect his shortness of breath is related to a CHF exacerbation.  He does not have any pleurisy to suggest PE  No evidence of pneumonia on chest x-ray, question whether viral illness may be the cause of his CHF exacerbation secondary to atrial fibrillation with RVR, now improved  Will send for CT chest without contrast to rule out pneumonia given elevated white blood cell count fever, will consult the hospitalist for admission      FINAL CLINICAL IMPRESSION(S) / ED DIAGNOSES   Final diagnoses:  SOB (shortness of breath)  Congestive heart failure, unspecified HF chronicity, unspecified heart failure type (HCC)  Paroxysmal atrial fibrillation (HCC)  Upper respiratory tract infection, unspecified type     Rx / DC Orders   ED Discharge Orders     None        Note:  This document was prepared using Dragon voice recognition  software and may include unintentional dictation errors.   Arlander Charleston, MD 03/06/24 1525  "

## 2024-03-06 NOTE — H&P (Signed)
 " History and Physical    Darrell Campbell FMW:[id number] DOB: January 26, 1991 DOA: 03/06/2024  DOS: the patient was seen and examined on 03/06/2024  PCP: Osker Tinnie HERO, FNP   Patient coming from: Home  I have personally briefly reviewed patient's old medical records in [facility] and CareEverywhere  HPI:   Darrell Campbell is a 33 y.o. year old male with medical history of hypertension, class III obesity, OSA, iron deficiency anemia presenting to the ED with worsening shortness of breath after having URI symptoms. He states symptoms started Tuesday morning. Then he developed worsening shortness of breath. He had mylagias.  On arrival to the ED patient was noted to be HDS stable.  CBC with significant leukocytosis at 19.2, hemoglobin at baseline consistent with microcytic anemia, CMP with significant hypokalemia at 3.1, significant AKI, proBNP elevated at 3831, troponin elevated at 134 with repeat pending.  Lactic acid normal.  D-dimer elevated.  Blood cultures obtained.  Chest x-ray without any acute findings but did show cardiomegaly and slight volume overload.  CT chest without contrast ordered and pending.  EDP gave patient 1 dose of IV Lasix  80 mg.  Given patient presentation, [facility] contacted for admission.  Review of Systems: As mentioned in the history of present illness. All other systems reviewed and are negative.   Past Medical History:  Diagnosis Date   COVID-19    03/2020   Gout    HTN (hypertension)    Hypertension    Morbid obesity (HCC)    Prediabetes     Past Surgical History:  Procedure Laterality Date   NO PAST SURGERIES       Allergies[1]  Family History  Problem Relation Age of Onset   Hypertension Mother    Alcohol abuse Father    Hypertension Sister    Hypertension Maternal Grandmother    Diabetes Maternal Grandmother    Cancer Maternal Grandmother        breast   Arthritis Maternal Grandmother    Alcohol abuse Maternal Grandfather    Bronchitis  Mother    Hypertension Father     Prior to Admission medications  Medication Sig Start Date End Date Taking? Authorizing Provider  amLODipine  (NORVASC ) 10 MG tablet TAKE 1 TABLET(10 MG) BY MOUTH DAILY 05/17/23   Malvina Alm DASEN, MD  Cholecalciferol  1.25 MG (50000 UT) capsule Take 1 capsule (50,000 Units total) by mouth once a week. 1x per week D3 10/04/20   McLean-Scocuzza, Randine SAILOR, MD  ferrous sulfate  325 (65 FE) MG tablet Take 1 tablet (325 mg total) by mouth daily with breakfast. 08/07/18   Guse, Tinnie HERO, FNP  folic acid  (FOLVITE ) 400 MCG tablet Take 1 tablet (400 mcg total) by mouth daily. 08/07/18   Osker Tinnie HERO, FNP  losartan -hydrochlorothiazide  (HYZAAR) 50-12.5 MG tablet Take 1 tablet by mouth daily. 05/17/23   Malvina Alm DASEN, MD  metoprolol  succinate (TOPROL -XL) 50 MG 24 hr tablet TAKE 1 TABLET BY MOUTH EVERY DAY WITH OR IMMEDIATELY FOLLOWING A MEAL 05/17/23   Malvina Alm DASEN, MD  potassium chloride  SA (KLOR-CON  M) 20 MEQ tablet Take 1 tablet (20 mEq total) by mouth 2 (two) times daily for 4 days. 05/17/23 05/21/23  Malvina Alm DASEN, MD  Vitamin D , Ergocalciferol , (DRISDOL ) 1.25 MG (50000 UT) CAPS capsule Take 1 capsule (50,000 Units total) by mouth every 7 (seven) days. 08/07/18   Osker Tinnie HERO, FNP    Social History:  reports that he has never smoked. He has  never used smokeless tobacco. He reports current alcohol use. He reports that he does not use drugs. Lives with mother, does not smoke or drink. Independent in ADLs, and iADLs.    Physical Exam: Vitals:   03/06/24 0930 03/06/24 1130 03/06/24 1200 03/06/24 1211  BP:  107/80 123/75 117/74  Pulse:  (!) 114 (!) 104 (!) 102  Resp:   (!) 38 (!) 40  Temp:    (!) 101.9 F (38.8 C)  TempSrc:    Oral  SpO2:    100%  Weight: (!) 174.6 kg     Height: 6' (1.829 m)       Gen: NAD, significantly obese male. HENT: NCAT, nasal cannula in place CV: sinus rhythm, tachycardic rate, lower extremity edema L>R Lung: Bibasilar rales but overall poor  air movement Abd: No TTP, normal bowel sounds MSK: L extremity edema > Right extremity, Neuro: alert and oriented   Labs on Admission: I have personally reviewed following labs and imaging studies  CBC: Recent Labs  Lab 03/06/24 0952  WBC 19.2*  HGB 9.4*  HCT 30.5*  MCV 74.9*  PLT 189   Basic Metabolic Panel: Recent Labs  Lab 03/06/24 0952  NA 140  K 3.1*  CL 101  CO2 24  GLUCOSE 126*  BUN 40*  CREATININE 4.26*  CALCIUM 9.0   GFR: Estimated Creatinine Clearance: 39.8 mL/min (A) (by C-G formula based on SCr of 4.26 mg/dL (H)). Liver Function Tests: Recent Labs  Lab 03/06/24 0952  AST 60*  ALT 25  ALKPHOS 78  BILITOT 0.7  PROT 8.1  ALBUMIN 3.5   No results for input(s): LIPASE, AMYLASE in the last 168 hours. No results for input(s): AMMONIA in the last 168 hours. Coagulation Profile: No results for input(s): INR, PROTIME in the last 168 hours. Cardiac Enzymes: No results for input(s): CKTOTAL, CKMB, CKMBINDEX, TROPONINI, TROPONINIHS in the last 168 hours. BNP (last 3 results) No results for input(s): BNP in the last 8760 hours. HbA1C: No results for input(s): HGBA1C in the last 72 hours. CBG: No results for input(s): GLUCAP in the last 168 hours. Lipid Profile: No results for input(s): CHOL, HDL, LDLCALC, TRIG, CHOLHDL, LDLDIRECT in the last 72 hours. Thyroid  Function Tests: No results for input(s): TSH, T4TOTAL, FREET4, T3FREE, THYROIDAB in the last 72 hours. Anemia Panel: No results for input(s): VITAMINB12, FOLATE, FERRITIN, TIBC, IRON, RETICCTPCT in the last 72 hours. Urine analysis:    Component Value Date/Time   COLORURINE YELLOW 09/30/2020 0858   APPEARANCEUR CLEAR 09/30/2020 0858   LABSPEC 1.014 09/30/2020 0858   PHURINE 6.5 09/30/2020 0858   GLUCOSEU NEGATIVE 09/30/2020 0858   HGBUR NEGATIVE 09/30/2020 0858   KETONESUR NEGATIVE 09/30/2020 0858   PROTEINUR TRACE (A) 09/30/2020  0858   NITRITE NEGATIVE 09/30/2020 0858   LEUKOCYTESUR TRACE (A) 09/30/2020 0858    Radiological Exams on Admission: I have personally reviewed images DG Chest Port 1 View Result Date: 03/06/2024 EXAM: 1 VIEW(S) XRAY OF THE CHEST 03/06/2024 10:41:00 AM COMPARISON: 07/21/2018. CLINICAL HISTORY: SOB (shortness of breath) FINDINGS: LINES, TUBES AND DEVICES: Multiple wires and leads project over the chest on the frontal radiograph. LUNGS AND PLEURA: Low lung volumes. Suboptimal evaluation of the inferior left hemithorax. No focal pulmonary opacity. No pleural effusion. No pneumothorax. HEART AND MEDIASTINUM: Mild cardiomegaly. BONES AND SOFT TISSUES: No acute osseous abnormality. LIMITATIONS/ARTIFACTS: Limited exam secondary to patient body habitus and AP portable technique. IMPRESSION: 1. No acute findings. 2. Limited AP radiograph given patient body habitus. 3.  Cardiomegaly. Electronically signed by: Rockey Kilts MD 03/06/2024 11:14 AM EST RP Workstation: [id number]    EKG: My personal interpretation of EKG shows: Sinus tachycardia without any acute ST changes.    Assessment/Plan Principal Problem:   Acute hypoxic respiratory failure (HCC) Active Problems:   Essential hypertension   Morbid obesity (HCC)   Iron deficiency anemia   Lymphedema   Prediabetes   OSA (obstructive sleep apnea)   Vitamin D  deficiency   AKI (acute kidney injury)   Hypokalemia   Patient with acute hypoxic respiratory failure likely multifactorial in setting of pneumonia.  There is also component of heart failure given elevated BNP, exam findings.  Patient initially on 6 L but currently on 3 L.  If she has worsening respiratory status will use BiPAP.  Will start patient on ceftriaxone  and azithromycin  for CAP coverage.  Will follow blood cultures, trend leukocyte count and monitor fever count.  AKI: Likely secondary to prerenal etiology including vascular congestion and decreased oral intake secondary to URI  symptoms.  Prior to given further IVF, will trend renal function after patient has been given IV Lasix  80 mg.  Repeat BMP ordered for 4 PM.  If creatinine is improving we will give repeat dose of Lasix  but if unchanged or worsening patient will benefit from fluids.  Patient's body habitus limits volume status assessment the patient with signs of volume overload on exam. Echo is ordered.   Essential hypertension: Holding home medicines given AKI and active infection.  Microcytic anemia: Hemoglobin at baseline.  Will get iron studies.  Continue oral iron replacement.  Hypokalemia: Pt with hypokalemia. Will replete to goal of 4. Checking magnesium .  Vitamin D  deficiency: Continue home vitamin D  supplementation.  OSA: Not on cpap at home due to cost issues. Will place on CPAP nightly.   VTE prophylaxis:  SQ Heparin   Diet: Heart healthy Code Status:  Full Code Telemetry:  Admission status: Inpatient, Progressive Patient is from: Home Anticipated d/c is to: Home Anticipated d/c is in: 3-4 days   Family Communication: Updated at bedside  Consults called: None   Severity of Illness: The appropriate patient status for this patient is INPATIENT. Inpatient status is judged to be reasonable and necessary in order to provide the required intensity of service to ensure the patient's safety. The patient's presenting symptoms, physical exam findings, and initial radiographic and laboratory data in the context of their chronic comorbidities is felt to place them at high risk for further clinical deterioration. Furthermore, it is not anticipated that the patient will be medically stable for discharge from the hospital within 2 midnights of admission.   * I certify that at the point of admission it is my clinical judgment that the patient will require inpatient hospital care spanning beyond 2 midnights from the point of admission due to high intensity of service, high risk for further deterioration and  high frequency of surveillance required.DEWAINE Morene Bathe, MD [facility] DEL. [facility]      [1]  Allergies Allergen Reactions   Yellow Jacket Venom Swelling   "

## 2024-03-07 ENCOUNTER — Inpatient Hospital Stay: Payer: Self-pay

## 2024-03-07 DIAGNOSIS — J9601 Acute respiratory failure with hypoxia: Secondary | ICD-10-CM

## 2024-03-07 LAB — ECHOCARDIOGRAM COMPLETE
AR max vel: 3.41 cm2
AV Area VTI: 4.08 cm2
AV Area mean vel: 3.63 cm2
AV Mean grad: 5 mmHg
AV Peak grad: 8.9 mmHg
Ao pk vel: 1.5 m/s
Area-P 1/2: 3.34 cm2
Height: 72 in
S' Lateral: 3.4 cm
Weight: 6160 [oz_av]

## 2024-03-07 LAB — BASIC METABOLIC PANEL WITH GFR
Anion gap: 15 (ref 5–15)
BUN: 46 mg/dL — ABNORMAL HIGH (ref 6–20)
CO2: 24 mmol/L (ref 22–32)
Calcium: 8.5 mg/dL — ABNORMAL LOW (ref 8.9–10.3)
Chloride: 102 mmol/L (ref 98–111)
Creatinine, Ser: 4.77 mg/dL — ABNORMAL HIGH (ref 0.61–1.24)
GFR, Estimated: 15 mL/min — ABNORMAL LOW
Glucose, Bld: 109 mg/dL — ABNORMAL HIGH (ref 70–99)
Potassium: 3.2 mmol/L — ABNORMAL LOW (ref 3.5–5.1)
Sodium: 140 mmol/L (ref 135–145)

## 2024-03-07 LAB — CBC
HCT: 29.2 % — ABNORMAL LOW (ref 39.0–52.0)
Hemoglobin: 9.1 g/dL — ABNORMAL LOW (ref 13.0–17.0)
MCH: 23.3 pg — ABNORMAL LOW (ref 26.0–34.0)
MCHC: 31.2 g/dL (ref 30.0–36.0)
MCV: 74.9 fL — ABNORMAL LOW (ref 80.0–100.0)
Platelets: 154 K/uL (ref 150–400)
RBC: 3.9 MIL/uL — ABNORMAL LOW (ref 4.22–5.81)
RDW: 17.6 % — ABNORMAL HIGH (ref 11.5–15.5)
WBC: 11.5 K/uL — ABNORMAL HIGH (ref 4.0–10.5)
nRBC: 0 % (ref 0.0–0.2)

## 2024-03-07 LAB — IRON AND TIBC
Iron: 14 ug/dL — ABNORMAL LOW (ref 45–182)
Saturation Ratios: 7 % — ABNORMAL LOW (ref 17.9–39.5)
TIBC: 195 ug/dL — ABNORMAL LOW (ref 250–450)
UIBC: 181 ug/dL

## 2024-03-07 LAB — GLUCOSE, CAPILLARY
Glucose-Capillary: 106 mg/dL — ABNORMAL HIGH (ref 70–99)
Glucose-Capillary: 89 mg/dL (ref 70–99)
Glucose-Capillary: 109 mg/dL — ABNORMAL HIGH (ref 70–99)
Glucose-Capillary: 127 mg/dL — ABNORMAL HIGH (ref 70–99)

## 2024-03-07 LAB — HEPATIC FUNCTION PANEL
ALT: 23 U/L (ref 0–44)
AST: 38 U/L (ref 15–41)
Albumin: 3.2 g/dL — ABNORMAL LOW (ref 3.5–5.0)
Alkaline Phosphatase: 72 U/L (ref 38–126)
Bilirubin, Direct: 0.2 mg/dL (ref 0.0–0.2)
Indirect Bilirubin: 0.2 mg/dL — ABNORMAL LOW (ref 0.3–0.9)
Total Bilirubin: 0.4 mg/dL (ref 0.0–1.2)
Total Protein: 7.6 g/dL (ref 6.5–8.1)

## 2024-03-07 LAB — PHOSPHORUS: Phosphorus: 3.7 mg/dL (ref 2.5–4.6)

## 2024-03-07 LAB — HEPATITIS C ANTIBODY: HCV Ab: NONREACTIVE

## 2024-03-07 LAB — FOLATE: Folate: 9.5 ng/mL

## 2024-03-07 LAB — MAGNESIUM: Magnesium: 2.1 mg/dL (ref 1.7–2.4)

## 2024-03-07 LAB — HEPATITIS B SURFACE ANTIGEN: Hepatitis B Surface Ag: NONREACTIVE

## 2024-03-07 LAB — HIV ANTIBODY (ROUTINE TESTING W REFLEX): HIV Screen 4th Generation wRfx: NONREACTIVE

## 2024-03-07 LAB — FERRITIN: Ferritin: 553 ng/mL — ABNORMAL HIGH (ref 24–336)

## 2024-03-07 LAB — HEPATITIS B CORE ANTIBODY, IGM: Hep B C IgM: NONREACTIVE

## 2024-03-07 MED ORDER — POTASSIUM CHLORIDE CRYS ER 20 MEQ PO TBCR
40.0000 meq | EXTENDED_RELEASE_TABLET | Freq: Once | ORAL | Status: AC
  Administered 2024-03-07: 40 meq via ORAL
  Filled 2024-03-07: qty 2

## 2024-03-07 MED ORDER — MAGNESIUM SULFATE 2 GM/50ML IV SOLN
2.0000 g | Freq: Once | INTRAVENOUS | Status: AC
  Administered 2024-03-07: 2 g via INTRAVENOUS
  Filled 2024-03-07: qty 50

## 2024-03-07 MED ORDER — HYDROMORPHONE HCL 1 MG/ML IJ SOLN
1.0000 mg | INTRAMUSCULAR | Status: DC | PRN
  Administered 2024-03-07 – 2024-03-09 (×8): 1 mg via INTRAVENOUS
  Filled 2024-03-07 (×8): qty 1

## 2024-03-07 NOTE — Progress Notes (Signed)
 " Progress Note   Patient: Darrell Campbell FMW:[id number] DOB: 1991-02-23 DOA: 03/06/2024     1 DOS: the patient was seen and examined on 03/07/2024   Brief hospital course:  Darrell Campbell A Hippler is a 33 y.o. year old male with medical history of hypertension, class III obesity, OSA, iron deficiency anemia presenting to the ED with worsening shortness of breath after having URI symptoms. He states symptoms started Tuesday morning. Then he developed worsening shortness of breath. He had mylagias.  On arrival to the ED patient was noted to be HDS stable.  CBC with significant leukocytosis at 19.2, hemoglobin at baseline consistent with microcytic anemia, CMP with significant hypokalemia at 3.1, significant AKI, proBNP elevated at 3831, troponin elevated at 134 with repeat pending.  Lactic acid normal.  D-dimer elevated.  Blood cultures obtained.  Chest x-ray without any acute findings but did show cardiomegaly and slight volume overload.  CT chest without contrast ordered and pending.  EDP gave patient 1 dose of IV Lasix  80 mg.  Given patient presentation, TRH contacted for admission.   Other hospital course as noted below:  Assessment and Plan:  Acute hypoxic respiratory failure likely multifactorial in setting of pneumonia.  Continue current antibiotic therapy Given BNP of 3831 echocardiogram was obtained that showed EF 70 to 75% with grade 2 diastolic dysfunction Patient received IV Lasix ,  We will defer continuation of Lasix  to nephrologist given worsening renal function Monitor input and output Continue supplemental oxygen to maintain appropriate saturation   AKI on CKD stage IIIb  Patient with baseline creatinine of 2 however creatinine today of 4.7 Nephrologist have been consulted Monitor renal function closely    Essential hypertension:  Holding nephrotoxic agents Continue amlodipine  Monitor blood pressure closely  Microcytic anemia:  Hemoglobin at baseline.   Follow-up on iron  studies   Hypokalemia:  Continue repletion and monitoring  Vitamin D  deficiency:  Continue vitamin D  supplementation  OSA:  Continue CPAP at night   VTE prophylaxis:  SQ Heparin    Diet: Heart healthy  Code Status:  Full Code    Family Communication: Updated at bedside   Subjective:  Patient seen and examined at bedside this morning Currently requiring 4 L of intranasal oxygen and patient does not use any oxygen at home Also having worsening of renal function for which nephrology's have been consulted He complained of pain involving the left lower extremity for which Doppler ultrasound have been requested  Physical Exam: Gen: NAD, significantly obese male. HENT: NCAT, nasal cannula in place CV: sinus rhythm, tachycardic rate, lower extremity edema L>R Lung: Bibasilar rales but overall poor air movement Abd: No TTP, normal bowel sounds MSK: L extremity edema > Right extremity, Neuro: alert and oriented  Vitals:   03/07/24 0503 03/07/24 0826 03/07/24 1016 03/07/24 1209  BP:  (!) 151/93  (!) 148/79  Pulse:  94  96  Resp:  (!) 22  (!) 22  Temp:  98.2 F (36.8 C)    TempSrc:  Oral    SpO2:  100% 96% 100%  Weight: (!) 174.6 kg     Height:        Data Reviewed: CT scan of the chest reviewed that showed ground glass opacity noted at the lung bases Worsening of renal failure also noted    Latest Ref Rng & Units 03/07/2024    4:15 AM 03/06/2024    9:52 AM 05/17/2023   10:02 AM  CBC  WBC 4.0 - 10.5 K/uL 11.5  19.2  9.7   Hemoglobin 13.0 - 17.0 g/dL 9.1  9.4  9.7   Hematocrit 39.0 - 52.0 % 29.2  30.5  32.0   Platelets 150 - 400 K/uL 154  189  250        Latest Ref Rng & Units 03/07/2024    4:15 AM 03/06/2024    5:52 PM 03/06/2024    9:52 AM  BMP  Glucose 70 - 99 mg/dL 890  884  873   BUN 6 - 20 mg/dL 46  43  40   Creatinine 0.61 - 1.24 mg/dL 5.22  5.26  5.73   Sodium 135 - 145 mmol/L 140  139  140   Potassium 3.5 - 5.1 mmol/L 3.2  3.1  3.1   Chloride 98 - 111  mmol/L 102  101  101   CO2 22 - 32 mmol/L 24  24  24    Calcium 8.9 - 10.3 mg/dL 8.5  8.7  9.0      Family Communication: Discussed with patient's mom at bedside  Disposition: Status is: Inpatient   Time spent: 51 minutes  Author: Drue ONEIDA Potter, MD 03/07/2024 3:49 PM  For on call review [email or web address].  "

## 2024-03-07 NOTE — Plan of Care (Signed)
Problem: Clinical Measurements: Goal: Cardiovascular complication will be avoided Outcome: Progressing   Problem: Nutrition: Goal: Adequate nutrition will be maintained Outcome: Progressing   Problem: Elimination: Goal: Will not experience complications related to urinary retention Outcome: Progressing   Problem: Safety: Goal: Ability to remain free from injury will improve Outcome: Progressing

## 2024-03-07 NOTE — Plan of Care (Signed)
   Problem: Coping: Goal: Level of anxiety will decrease Outcome: Progressing   Problem: Pain Managment: Goal: General experience of comfort will improve and/or be controlled Outcome: Progressing   Problem: Safety: Goal: Ability to remain free from injury will improve Outcome: Progressing

## 2024-03-07 NOTE — Evaluation (Signed)
 Physical Therapy Evaluation Patient Details Name: Darrell Campbell MRN: [id number] DOB: 28-Mar-1990 Today's Date: 03/07/2024  History of Present Illness  Darrell Campbell is a 33yoM who comes to [facility] on 12/25 with 3 days URI symptoms, LLE edema. PMH: HTN, OSA, hypoferritinemia.  BNP: 3831, pt febrile, RVP negative.  Clinical Impression  Pt  reports left lower leg/foot pain continues to be significantly elevated compared to baseline pain; 7/10 at rest and much closer to 10/10 when moving limb and performing bed mobility. Pt reports only having tylenol  last night which did not help much. Pt unable to tolerate any appreciable mobility due to high pain levels. Appears pt has not had LLE formally diagnoses, however consistent with lymphedema, particularly given report of similar presentation with his grandmother. LLE has mild redness in the ankle as well. Pt reports having gout several years ago, none since then. Pt assisted with limb elevation at end of session. RN made aware that pain is not currently at goal and impacting ability to tolerate mobility at this time.       If plan is discharge home, recommend the following: Two people to help with walking and/or transfers;Help with stairs or ramp for entrance   Can travel by private vehicle        Equipment Recommendations None recommended by PT  Recommendations for Other Services       Functional Status Assessment Patient has had a recent decline in their functional status and demonstrates the ability to make significant improvements in function in a reasonable and predictable amount of time.     Precautions / Restrictions Precautions Precautions: Fall Recall of Precautions/Restrictions: Intact Restrictions Weight Bearing Restrictions Per Provider Order: No      Mobility  Bed Mobility Overal bed mobility: Needs Assistance Bed Mobility: Supine to Sit, Sit to Supine           General bed mobility comments: assisted with bed  features; then able to pull self up with great pain amounts; attempts to come to EOB, futile, pt having 10/10 pain once LLE is out of bed.    Transfers   Equipment used:  (unable)                    Ambulation/Gait                  Stairs            Wheelchair Mobility     Tilt Bed    Modified Rankin (Stroke Patients Only)       Balance                                             Pertinent Vitals/Pain Pain Assessment Pain Assessment: 0-10 Pain Score: 7  (increases considerably when trying any movement of limb) Faces Pain Scale: Hurts even more Pain Location: Left foot pain Pain Intervention(s): Limited activity within patient's tolerance, Patient requesting pain meds-RN notified    Home Living Family/patient expects to be discharged to:: Private residence Living Arrangements: Parent Available Help at Discharge: Family Type of Home: House Home Access: Level entry     Alternate Level Stairs-Number of Steps: 13 Home Layout: Two level;1/2 bath on main level;Bed/bath upstairs;Able to live on main level with bedroom/bathroom        Prior Function Prior Level of Function : Independent/Modified Independent  Extremity/Trunk Assessment                Communication        Cognition                                         Cueing       General Comments      Exercises     Assessment/Plan    PT Assessment Patient needs continued PT services  PT Problem List Decreased strength;Decreased activity tolerance;Decreased mobility       PT Treatment Interventions DME instruction;Gait training;Stair training;Functional mobility training;Therapeutic activities;Therapeutic exercise;Balance training;Neuromuscular re-education;Patient/family education    PT Goals (Current goals can be found in the Care Plan section)  Acute Rehab PT Goals PT Goal Formulation: Patient unable to  participate in goal setting    Frequency Min 2X/week     Co-evaluation               AM-PAC PT 6 Clicks Mobility  Outcome Measure Help needed turning from your back to your side while in a flat bed without using bedrails?: A Little Help needed moving from lying on your back to sitting on the side of a flat bed without using bedrails?: A Little Help needed moving to and from a bed to a chair (including a wheelchair)?: A Little Help needed standing up from a chair using your arms (e.g., wheelchair or bedside chair)?: A Little Help needed to walk in hospital room?: A Little Help needed climbing 3-5 steps with a railing? : A Little 6 Click Score: 18    End of Session Equipment Utilized During Treatment: Oxygen Activity Tolerance: Patient tolerated treatment well;No increased pain Patient left: in bed;with call bell/phone within reach (LLE elevated on 1 pillow + bed features) Nurse Communication: Mobility status PT Visit Diagnosis: Unsteadiness on feet (R26.81);Other abnormalities of gait and mobility (R26.89);Muscle weakness (generalized) (M62.81)    Time: 9046-8987 PT Time Calculation (min) (ACUTE ONLY): 19 min   Charges:   PT Evaluation $PT Eval Moderate Complexity: 1 Mod   PT General Charges $$ ACUTE PT VISIT: 1 Visit    10:33 AM, 03/07/2024 Peggye JAYSON Linear, PT, DPT Physical Therapist - [facility]  [phone] (ASCOM)       Armin Yerger C 03/07/2024, 10:30 AM

## 2024-03-08 LAB — URINALYSIS, COMPLETE (UACMP) WITH MICROSCOPIC
Bilirubin Urine: NEGATIVE
Glucose, UA: NEGATIVE mg/dL
Ketones, ur: NEGATIVE mg/dL
Nitrite: NEGATIVE
Protein, ur: 100 mg/dL — AB
Specific Gravity, Urine: 1.013 (ref 1.005–1.030)
pH: 5 (ref 5.0–8.0)

## 2024-03-08 LAB — BASIC METABOLIC PANEL WITH GFR
Anion gap: 14 (ref 5–15)
BUN: 44 mg/dL — ABNORMAL HIGH (ref 6–20)
CO2: 24 mmol/L (ref 22–32)
Calcium: 8.6 mg/dL — ABNORMAL LOW (ref 8.9–10.3)
Chloride: 101 mmol/L (ref 98–111)
Creatinine, Ser: 3.94 mg/dL — ABNORMAL HIGH (ref 0.61–1.24)
GFR, Estimated: 19 mL/min — ABNORMAL LOW
Glucose, Bld: 112 mg/dL — ABNORMAL HIGH (ref 70–99)
Potassium: 3.2 mmol/L — ABNORMAL LOW (ref 3.5–5.1)
Sodium: 138 mmol/L (ref 135–145)

## 2024-03-08 LAB — CBC WITH DIFFERENTIAL/PLATELET
Abs Immature Granulocytes: 0.1 K/uL — ABNORMAL HIGH (ref 0.00–0.07)
Basophils Absolute: 0 K/uL (ref 0.0–0.1)
Basophils Relative: 0 %
Eosinophils Absolute: 0.1 K/uL (ref 0.0–0.5)
Eosinophils Relative: 1 %
HCT: 30 % — ABNORMAL LOW (ref 39.0–52.0)
Hemoglobin: 8.9 g/dL — ABNORMAL LOW (ref 13.0–17.0)
Immature Granulocytes: 1 %
Lymphocytes Relative: 14 %
Lymphs Abs: 1.6 K/uL (ref 0.7–4.0)
MCH: 22.6 pg — ABNORMAL LOW (ref 26.0–34.0)
MCHC: 29.7 g/dL — ABNORMAL LOW (ref 30.0–36.0)
MCV: 76.1 fL — ABNORMAL LOW (ref 80.0–100.0)
Monocytes Absolute: 1.5 K/uL — ABNORMAL HIGH (ref 0.1–1.0)
Monocytes Relative: 12 %
Neutro Abs: 8.6 K/uL — ABNORMAL HIGH (ref 1.7–7.7)
Neutrophils Relative %: 72 %
Platelets: 159 K/uL (ref 150–400)
RBC: 3.94 MIL/uL — ABNORMAL LOW (ref 4.22–5.81)
RDW: 17.7 % — ABNORMAL HIGH (ref 11.5–15.5)
WBC: 11.9 K/uL — ABNORMAL HIGH (ref 4.0–10.5)
nRBC: 0 % (ref 0.0–0.2)

## 2024-03-08 LAB — HEMOGLOBIN A1C
Hgb A1c MFr Bld: 6.4 % — ABNORMAL HIGH (ref 4.8–5.6)
Mean Plasma Glucose: 136.98 mg/dL

## 2024-03-08 LAB — GLUCOSE, CAPILLARY
Glucose-Capillary: 101 mg/dL — ABNORMAL HIGH (ref 70–99)
Glucose-Capillary: 109 mg/dL — ABNORMAL HIGH (ref 70–99)
Glucose-Capillary: 110 mg/dL — ABNORMAL HIGH (ref 70–99)

## 2024-03-08 LAB — VITAMIN B12: Vitamin B-12: 639 pg/mL (ref 180–914)

## 2024-03-08 MED ORDER — CALCIUM CARBONATE ANTACID 500 MG PO CHEW
1.0000 | CHEWABLE_TABLET | Freq: Once | ORAL | Status: AC
  Administered 2024-03-08: 200 mg via ORAL
  Filled 2024-03-08: qty 1

## 2024-03-08 MED ORDER — POTASSIUM CHLORIDE CRYS ER 20 MEQ PO TBCR
40.0000 meq | EXTENDED_RELEASE_TABLET | Freq: Once | ORAL | Status: AC
  Administered 2024-03-08: 40 meq via ORAL
  Filled 2024-03-08: qty 2

## 2024-03-08 NOTE — Plan of Care (Signed)

## 2024-03-08 NOTE — Consult Note (Signed)
 " [facility]  CONSULT NOTE    Date: 03/08/2024                  Patient Name:  Darrell Campbell  MRN: [id number]  DOB: 1990-06-07  Age / Sex: 33 y.o., male         PCP: Osker Tinnie HERO, FNP                 Service Requesting Consult: Dr. Dorinda                 Reason for Consult: Acute Kidney Injury            History of Present Illness: Mr. Darrell Campbell admitted from home after a few days of shortness of breath, upper respiratory infection and fevers and chills. Patient states he was in his usual state of health prior to this. He denies any sick contacts. He has not been vaccinated. Negative for RSV, COVID-19 and influenza. Admitted to [facility] with leukocytosis, left lower extremity edema, and hypoxia.   Patient on 4 liters of O2 when examined.   Patient continues to complain of left lower extremity pain.   Denies being told he has chronic kidney disease in the past. Denies history of diabetes. Denies use of nonsteroidal anti-inflammatory agents. Not using a CPAP at night.    Medications: Outpatient medications: Medications Prior to Admission  Medication Sig Dispense Refill Last Dose/Taking   amLODipine  (NORVASC ) 10 MG tablet TAKE 1 TABLET(10 MG) BY MOUTH DAILY (Patient not taking: Reported on 03/06/2024) 30 tablet 2 Not Taking   Cholecalciferol  1.25 MG (50000 UT) capsule Take 1 capsule (50,000 Units total) by mouth once a week. 1x per week D3 (Patient not taking: Reported on 03/06/2024) 13 capsule 1 Not Taking   ferrous sulfate  325 (65 FE) MG tablet Take 1 tablet (325 mg total) by mouth daily with breakfast. (Patient not taking: Reported on 03/06/2024) 90 tablet 1 Not Taking   folic acid  (FOLVITE ) 400 MCG tablet Take 1 tablet (400 mcg total) by mouth daily. (Patient not taking: Reported on 03/06/2024) 90 tablet 1 Not Taking   losartan -hydrochlorothiazide  (HYZAAR) 50-12.5 MG tablet Take 1 tablet by mouth daily. (Patient not taking: Reported on 03/06/2024)  90 tablet 3 Not Taking   metoprolol  succinate (TOPROL -XL) 50 MG 24 hr tablet TAKE 1 TABLET BY MOUTH EVERY DAY WITH OR IMMEDIATELY FOLLOWING A MEAL (Patient not taking: Reported on 03/06/2024) 30 tablet 2 Not Taking   potassium chloride  SA (KLOR-CON  M) 20 MEQ tablet Take 1 tablet (20 mEq total) by mouth 2 (two) times daily for 4 days. 8 tablet 0    Vitamin D , Ergocalciferol , (DRISDOL ) 1.25 MG (50000 UT) CAPS capsule Take 1 capsule (50,000 Units total) by mouth every 7 (seven) days. (Patient not taking: Reported on 03/06/2024) 12 capsule 1 Not Taking    Current medications: Current Facility-Administered Medications  Medication Dose Route Frequency Provider Last Rate Last Admin   acetaminophen  (TYLENOL ) tablet 650 mg  650 mg Oral Q6H PRN Khan, Ghalib, MD   650 mg at 03/08/24 0915   Or   acetaminophen  (TYLENOL ) suppository 650 mg  650 mg Rectal Q6H PRN Fernand Prost, MD       amLODipine  (NORVASC ) tablet 5 mg  5 mg Oral Daily Khan, Ghalib, MD   5 mg at 03/08/24 9083   azithromycin  (ZITHROMAX ) 500 mg in sodium chloride  0.9 % 250 mL IVPB  500 mg Intravenous Q24H Fernand Prost, MD  250 mL/hr at 03/08/24 0022 500 mg at 03/08/24 0022   cefTRIAXone  (ROCEPHIN ) 2 g in sodium chloride  0.9 % 100 mL IVPB  2 g Intravenous Q24H Khan, Ghalib, MD 200 mL/hr at 03/08/24 0019 2 g at 03/08/24 0019   heparin  injection 5,000 Units  5,000 Units Subcutaneous Q8H Khan, Ghalib, MD   5,000 Units at 03/08/24 0546   HYDROmorphone  (DILAUDID ) injection 1 mg  1 mg Intravenous Q4H PRN Dorinda Drue DASEN, MD   1 mg at 03/08/24 9077   ipratropium-albuterol  (DUONEB) 0.5-2.5 (3) MG/3ML nebulizer solution 3 mL  3 mL Nebulization Q6H PRN Fernand Prost, MD       ondansetron  (ZOFRAN ) tablet 4 mg  4 mg Oral Q6H PRN Fernand Prost, MD       Or   ondansetron  (ZOFRAN ) injection 4 mg  4 mg Intravenous Q6H PRN Khan, Ghalib, MD       senna-docusate (Senokot-S) tablet 1 tablet  1 tablet Oral QHS PRN Fernand Prost, MD       sodium chloride  flush (NS) 0.9 %  injection 3 mL  3 mL Intravenous Q12H Khan, Ghalib, MD   3 mL at 03/08/24 9083      Allergies: Allergies[1]    Past Medical History: Past Medical History:  Diagnosis Date   COVID-19    03/2020   Gout    HTN (hypertension)    Hypertension    Morbid obesity (HCC)    Prediabetes      Past Surgical History: Past Surgical History:  Procedure Laterality Date   NO PAST SURGERIES       Family History: Family History  Problem Relation Age of Onset   Hypertension Mother    Alcohol abuse Father    Hypertension Sister    Hypertension Maternal Grandmother    Diabetes Maternal Grandmother    Cancer Maternal Grandmother        breast   Arthritis Maternal Grandmother    Alcohol abuse Maternal Grandfather    Bronchitis Mother    Hypertension Father      Social History: Social History   Socioeconomic History   Marital status: Single    Spouse name: Not on file   Number of children: Not on file   Years of education: Not on file   Highest education level: Not on file  Occupational History   Not on file  Tobacco Use   Smoking status: Never   Smokeless tobacco: Never  Vaping Use   Vaping status: Never Used  Substance and Sexual Activity   Alcohol use: Yes    Comment: Occassionally   Drug use: Never   Sexual activity: Yes    Partners: Male  Other Topics Concern   Not on file  Social History Narrative   ** Merged History Encounter **       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 Degree 04/2016    Social Drivers of Health   Tobacco Use: Low Risk (03/06/2024)   Patient History    Smoking Tobacco Use: Never    Smokeless Tobacco Use: Never    Passive Exposure: Not on file  Financial Resource Strain: Not on file  Food Insecurity: No Food Insecurity (03/06/2024)   Epic    Worried About Programme Researcher, Broadcasting/film/video in the Last Year: Never true    Ran Out of Food in the Last Year: Never true  Transportation Needs: No Transportation Needs (03/06/2024)   Epic     Lack of Transportation (Medical): No  Lack of Transportation (Non-Medical): No  Physical Activity: Not on file  Stress: Not on file  Social Connections: Patient Declined (03/06/2024)   Social Connection and Isolation Panel    Frequency of Communication with Friends and Family: Patient declined    Frequency of Social Gatherings with Friends and Family: Patient declined    Attends Religious Services: Patient declined    Database Administrator or Organizations: Patient declined    Attends Banker Meetings: Patient declined    Marital Status: Patient declined  Intimate Partner Violence: Not At Risk (03/06/2024)   Epic    Fear of Current or Ex-Partner: No    Emotionally Abused: No    Physically Abused: No    Sexually Abused: No  Depression (PHQ2-9): Not on file  Alcohol Screen: Not on file  Housing: Unknown (03/06/2024)   Epic    Unable to Pay for Housing in the Last Year: No    Number of Times Moved in the Last Year: Not on file    Homeless in the Last Year: No  Utilities: Not At Risk (03/06/2024)   Epic    Threatened with loss of utilities: No  Health Literacy: Not on file     Vital Signs: Blood pressure 134/75, pulse 99, temperature (!) 101.2 F (38.4 C), temperature source Oral, resp. rate 20, height 6' (1.829 m), weight (!) 174.6 kg, SpO2 99%.  Weight trends: Filed Weights   03/06/24 0930 03/07/24 0503 03/08/24 0500  Weight: (!) 174.6 kg (!) 174.6 kg (!) 174.6 kg    Physical Exam: General: NAD, laying in bed  Head: Normocephalic, atraumatic. Moist oral mucosal membranes  Eyes: Anicteric, PERRL  Neck: Supple, trachea midline  Lungs:  Diminished bilaterally, 4 L [place] O2  Heart: Regular rate and rhythm  Abdomen:  Soft, nontender,   Extremities:  +++ peripheral edema. Left > right  Neurologic: Nonfocal, moving all four extremities  Skin: No lesions  Access: none     Lab results: Basic Metabolic Panel: Recent Labs  Lab 03/06/24 1533  03/06/24 1752 03/07/24 0415 03/07/24 1753 03/08/24 0338  NA  --  139 140  --  138  K  --  3.1* 3.2*  --  3.2*  CL  --  101 102  --  101  CO2  --  24 24  --  24  GLUCOSE  --  115* 109*  --  112*  BUN  --  43* 46*  --  44*  CREATININE  --  4.73* 4.77*  --  3.94*  CALCIUM   --  8.7* 8.5*  --  8.6*  MG 1.5*  --   --  2.1  --   PHOS  --   --   --  3.7  --     Liver Function Tests: Recent Labs  Lab 03/06/24 0952 03/07/24 1753  AST 60* 38  ALT 25 23  ALKPHOS 78 72  BILITOT 0.7 0.4  PROT 8.1 7.6  ALBUMIN 3.5 3.2*   No results for input(s): LIPASE, AMYLASE in the last 168 hours. No results for input(s): AMMONIA in the last 168 hours.  CBC: Recent Labs  Lab 03/06/24 0952 03/07/24 0415 03/08/24 0338  WBC 19.2* 11.5* 11.9*  NEUTROABS  --   --  8.6*  HGB 9.4* 9.1* 8.9*  HCT 30.5* 29.2* 30.0*  MCV 74.9* 74.9* 76.1*  PLT 189 154 159    Cardiac Enzymes: No results for input(s): CKTOTAL, CKMB, CKMBINDEX, TROPONINI in the last 168 hours.  BNP: Invalid input(s): POCBNP  CBG: Recent Labs  Lab 03/07/24 0823 03/07/24 1212 03/07/24 1657 03/07/24 2136 03/08/24 0818  GLUCAP 106* 89 109* 127* 101*    Microbiology: Results for orders placed or performed during the hospital encounter of 03/06/24  Resp panel by RT-PCR (RSV, Flu A&B, Covid) Anterior Nasal Swab     Status: None   Collection Time: 03/06/24  9:26 AM   Specimen: Anterior Nasal Swab  Result Value Ref Range Status   SARS Coronavirus 2 by RT PCR NEGATIVE NEGATIVE Final    Comment: (NOTE) SARS-CoV-2 target nucleic acids are NOT DETECTED.  The SARS-CoV-2 RNA is generally detectable in upper respiratory specimens during the acute phase of infection. The lowest concentration of SARS-CoV-2 viral copies this assay can detect is 138 copies/mL. A negative result does not preclude SARS-Cov-2 infection and should not be used as the sole basis for treatment or other patient management decisions. A  negative result may occur with  improper specimen collection/handling, submission of specimen other than nasopharyngeal swab, presence of viral mutation(s) within the areas targeted by this assay, and inadequate number of viral copies(<138 copies/mL). A negative result must be combined with clinical observations, patient history, and epidemiological information. The expected result is Negative.  Fact Sheet for Patients:  bloggercourse.com  Fact Sheet for Healthcare Providers:  seriousbroker.it  This test is no t yet approved or cleared by the [place]  FDA and  has been authorized for detection and/or diagnosis of SARS-CoV-2 by FDA under an Emergency Use Authorization (EUA). This EUA will remain  in effect (meaning this test can be used) for the duration of the COVID-19 declaration under Section 564(b)(1) of the Act, 21 U.S.C.section 360bbb-3(b)(1), unless the authorization is terminated  or revoked sooner.       Influenza A by PCR NEGATIVE NEGATIVE Final   Influenza B by PCR NEGATIVE NEGATIVE Final    Comment: (NOTE) The Xpert Xpress SARS-CoV-2/FLU/RSV plus assay is intended as an aid in the diagnosis of influenza from Nasopharyngeal swab specimens and should not be used as a sole basis for treatment. Nasal washings and aspirates are unacceptable for Xpert Xpress SARS-CoV-2/FLU/RSV testing.  Fact Sheet for Patients: bloggercourse.com  Fact Sheet for Healthcare Providers: seriousbroker.it  This test is not yet approved or cleared by the [place]  FDA and has been authorized for detection and/or diagnosis of SARS-CoV-2 by FDA under an Emergency Use Authorization (EUA). This EUA will remain in effect (meaning this test can be used) for the duration of the COVID-19 declaration under Section 564(b)(1) of the Act, 21 U.S.C. section 360bbb-3(b)(1), unless the authorization  is terminated or revoked.     Resp Syncytial Virus by PCR NEGATIVE NEGATIVE Final    Comment: (NOTE) Fact Sheet for Patients: bloggercourse.com  Fact Sheet for Healthcare Providers: seriousbroker.it  This test is not yet approved or cleared by the [place]  FDA and has been authorized for detection and/or diagnosis of SARS-CoV-2 by FDA under an Emergency Use Authorization (EUA). This EUA will remain in effect (meaning this test can be used) for the duration of the COVID-19 declaration under Section 564(b)(1) of the Act, 21 U.S.C. section 360bbb-3(b)(1), unless the authorization is terminated or revoked.  Performed at [facility], [address] [place], [place] [address]   Blood culture (routine x 2)     Status: None (Preliminary result)   Collection Time: 03/06/24  9:52 AM   Specimen: BLOOD  Result Value Ref Range Status   Specimen Description BLOOD  BLOOD RIGHT ARM  Final   Special Requests   Final    BOTTLES DRAWN AEROBIC AND ANAEROBIC Blood Culture results may not be optimal due to an inadequate volume of blood received in culture bottles   Culture   Final    NO GROWTH 2 DAYS Performed at [facility], [address] [place], [place] [address]    Report Status PENDING  Incomplete  Blood culture (routine x 2)     Status: None (Preliminary result)   Collection Time: 03/06/24  9:52 AM   Specimen: BLOOD  Result Value Ref Range Status   Specimen Description BLOOD BLOOD LEFT HAND  Final   Special Requests   Final    BOTTLES DRAWN AEROBIC AND ANAEROBIC Blood Culture adequate volume   Culture   Final    NO GROWTH 2 DAYS Performed at [facility], [address] [place], [place] [address]    Report Status PENDING  Incomplete    Coagulation Studies: No results for input(s): LABPROT, INR in the last 72 hours.  Urinalysis: Recent Labs    03/08/24 0152  COLORURINE YELLOW*  LABSPEC  1.013  PHURINE 5.0  GLUCOSEU NEGATIVE  HGBUR MODERATE*  BILIRUBINUR NEGATIVE  KETONESUR NEGATIVE  PROTEINUR 100*  NITRITE NEGATIVE  LEUKOCYTESUR SMALL*      Imaging: US  RENAL Result Date: 03/08/2024 CLINICAL DATA:  Acute renal failure. EXAM: RENAL / URINARY TRACT ULTRASOUND COMPLETE COMPARISON:  None Available. FINDINGS: Right Kidney: Renal measurements: 10.4 x 5.2 x 5.7 cm = volume: 163 mL. Echogenicity within normal limits. No mass or hydronephrosis visualized. Left Kidney: Renal measurements: 11.5 x 5.2 x 5.4 cm = volume: 170 mL. Echogenicity within normal limits. No mass or hydronephrosis visualized. Bladder: Appears normal for degree of bladder distention. Other: None. IMPRESSION: No evidence for hydronephrosis. Electronically Signed   By: Camellia Candle M.D.   On: 03/08/2024 05:55   ECHOCARDIOGRAM COMPLETE Result Date: 03/07/2024    ECHOCARDIOGRAM REPORT   Patient Name:   LLES SUR A Scullin Date of Exam: 03/06/2024 Medical Rec #:  [id number]         Height:       72.0 in Accession #:    [id number]        Weight:       385.0 lb Date of Birth:  1990/09/29         BSA:          2.814 m Patient Age:    33 years          BP:           117/74 mmHg Patient Gender: M                 HR:           108 bpm. Exam Location:  [facility] Procedure: 2D Echo, Color Doppler, Cardiac Doppler and Intracardiac            Opacification Agent (Both Spectral and Color Flow Doppler were            utilized during procedure). Indications:     I51.7 Cardiomegaly  History:         Patient has no prior history of Echocardiogram examinations.                  Risk Factors:Sleep Apnea, Morbid Obesity and Hypertension.  Sonographer:     L. Thornton-Maynard Referring Phys:  [id number] [facility] Diagnosing Phys: Dwayne D Callwood MD  Sonographer Comments:  Image acquisition challenging due to patient body habitus. IMPRESSIONS  1. Left ventricular ejection fraction, by estimation, is 70 to 75%. The left ventricle has hyperdynamic  function. The left ventricle has no regional wall motion abnormalities. There is moderate concentric left ventricular hypertrophy. Left ventricular diastolic parameters are consistent with Grade II diastolic dysfunction (pseudonormalization).  2. Right ventricular systolic function is normal. The right ventricular size is mildly enlarged. Mildly increased right ventricular wall thickness. There is normal pulmonary artery systolic pressure.  3. Left atrial size was mild to moderately dilated.  4. Right atrial size was mildly dilated.  5. The mitral valve is normal in structure. Trivial mitral valve regurgitation.  6. The aortic valve is normal in structure. Aortic valve regurgitation is not visualized. FINDINGS  Left Ventricle: Left ventricular ejection fraction, by estimation, is 70 to 75%. The left ventricle has hyperdynamic function. The left ventricle has no regional wall motion abnormalities. Definity  contrast agent was given IV to delineate the left ventricular endocardial borders. Strain was performed and the global longitudinal strain is indeterminate. The left ventricular internal cavity size was normal in size. There is moderate concentric left ventricular hypertrophy. Left ventricular diastolic  parameters are consistent with Grade II diastolic dysfunction (pseudonormalization). Right Ventricle: The right ventricular size is mildly enlarged. Mildly increased right ventricular wall thickness. Right ventricular systolic function is normal. There is normal pulmonary artery systolic pressure. The tricuspid regurgitant velocity is 2.76 m/s, and with an assumed right atrial pressure of 3 mmHg, the estimated right ventricular systolic pressure is 33.5 mmHg. Left Atrium: Left atrial size was mild to moderately dilated. Right Atrium: Right atrial size was mildly dilated. Pericardium: There is no evidence of pericardial effusion. Mitral Valve: The mitral valve is normal in structure. Trivial mitral valve  regurgitation. Tricuspid Valve: The tricuspid valve is normal in structure. Tricuspid valve regurgitation is not demonstrated. Aortic Valve: The aortic valve is normal in structure. Aortic valve regurgitation is not visualized. Aortic valve mean gradient measures 5.0 mmHg. Aortic valve peak gradient measures 8.9 mmHg. Aortic valve area, by VTI measures 4.08 cm. Pulmonic Valve: The pulmonic valve was not well visualized. Pulmonic valve regurgitation is not visualized. Aorta: The ascending aorta was not well visualized. IAS/Shunts: No atrial level shunt detected by color flow Doppler. Additional Comments: 3D was performed not requiring image post processing on an independent workstation and was indeterminate.  LEFT VENTRICLE PLAX 2D LVIDd:         4.70 cm   Diastology LVIDs:         3.40 cm   LV e' medial:    7.29 cm/s LV PW:         1.60 cm   LV E/e' medial:  12.2 LV IVS:        1.80 cm   LV e' lateral:   6.42 cm/s LVOT diam:     2.50 cm   LV E/e' lateral: 13.9 LV SV:         82 LV SV Index:   29 LVOT Area:     4.91 cm LV IVRT:       77 msec  RIGHT VENTRICLE RV S prime:     18.00 cm/s TAPSE (M-mode): 2.1 cm LEFT ATRIUM             Index        RIGHT ATRIUM           Index LA diam:        4.50 cm 1.60 cm/m  RA Area:     27.30 cm LA Vol (A2C):   66.5 ml 23.63 ml/m  RA Volume:   92.50 ml  32.87 ml/m LA Vol (A4C):   56.6 ml 20.11 ml/m LA Biplane Vol: 63.4 ml 22.53 ml/m  AORTIC VALVE                     PULMONIC VALVE AV Area (Vmax):    3.41 cm      PV Vmax:       1.31 m/s AV Area (Vmean):   3.63 cm      PV Peak grad:  6.9 mmHg AV Area (VTI):     4.08 cm AV Vmax:           149.50 cm/s AV Vmean:          101.500 cm/s AV VTI:            0.202 m AV Peak Grad:      8.9 mmHg AV Mean Grad:      5.0 mmHg LVOT Vmax:         104.00 cm/s LVOT Vmean:        75.100 cm/s LVOT VTI:          0.168 m LVOT/AV VTI ratio: 0.83  AORTA Ao Root diam: 3.60 cm Ao Asc diam:  3.50 cm MITRAL VALVE               TRICUSPID VALVE MV Area  (PHT): 3.34 cm    TR Peak grad:   30.5 mmHg MV Decel Time: 227 msec    TR Vmax:        276.00 cm/s MV E velocity: 89.10 cm/s MV A velocity: 84.20 cm/s  SHUNTS MV E/A ratio:  1.06        Systemic VTI:  0.17 m                            Systemic Diam: 2.50 cm Cara JONETTA Lovelace MD Electronically signed by Cara JONETTA Lovelace MD Signature Date/Time: 03/07/2024/12:03:21 PM    Final    CT Chest Wo Contrast Result Date: 03/06/2024 CLINICAL DATA:  Shortness of breath. EXAM: CT CHEST WITHOUT CONTRAST TECHNIQUE: Multidetector CT imaging of the chest was performed following the standard protocol without IV contrast. RADIATION DOSE REDUCTION: This exam was performed according to the departmental dose-optimization program which includes automated exposure control, adjustment of the mA and/or kV according to patient size and/or use of iterative reconstruction technique. COMPARISON:  None Available. FINDINGS: Cardiovascular: Heart size upper normal. Trace pericardial effusion evident. No thoracic aortic aneurysm. No substantial atherosclerotic calcification of the thoracic aorta. Mediastinum/Nodes: No mediastinal lymphadenopathy. No evidence for gross hilar lymphadenopathy although assessment is limited by the lack of intravenous contrast on the current study. The esophagus has normal imaging features. There is no axillary lymphadenopathy. Lungs/Pleura: Scattered calcified granulomata noted in both lungs. No overtly suspicious pulmonary nodule or mass. No focal airspace consolidation. No pleural effusion. Mosaic ground-glass opacity in the dependent lung bases may be atelectasis or sequelae of small airways disease. Upper Abdomen: The liver shows diffusely decreased attenuation suggesting fat deposition. Musculoskeletal: No worrisome lytic or sclerotic osseous abnormality. IMPRESSION: 1. Mosaic ground-glass opacity in the dependent lung bases may be atelectasis or sequelae of small airways disease. 2. Hepatic steatosis.  Electronically Signed   By: Camellia Candle M.D.   On: 03/06/2024 13:22   DG Chest [facility] 1 View Result  Date: 03/06/2024 EXAM: 1 VIEW(S) XRAY OF THE CHEST 03/06/2024 10:41:00 AM COMPARISON: 07/21/2018. CLINICAL HISTORY: SOB (shortness of breath) FINDINGS: LINES, TUBES AND DEVICES: Multiple wires and leads project over the chest on the frontal radiograph. LUNGS AND PLEURA: Low lung volumes. Suboptimal evaluation of the inferior left hemithorax. No focal pulmonary opacity. No pleural effusion. No pneumothorax. HEART AND MEDIASTINUM: Mild cardiomegaly. BONES AND SOFT TISSUES: No acute osseous abnormality. LIMITATIONS/ARTIFACTS: Limited exam secondary to patient body habitus and AP portable technique. IMPRESSION: 1. No acute findings. 2. Limited AP radiograph given patient body habitus. 3. Cardiomegaly. Electronically signed by: Rockey Kilts MD 03/06/2024 11:14 AM EST RP Workstation: [id number]     Assessment & Plan: Mr. Alanzo Lamb Cassis is a 33 y.o.  male with hypertension, sleep apnea, obesity and anemia, who was admitted to [facility] on 03/06/2024 for Paroxysmal atrial fibrillation (HCC) [I48.0] SOB (shortness of breath) [R06.02] Upper respiratory tract infection, unspecified type [J06.9] Congestive heart failure, unspecified HF chronicity, unspecified heart failure type (HCC) [I50.9] Acute hypoxic respiratory failure (HCC) [J96.01]   Acute Kidney Injury on chronic kidney disease stage IIIB with proteinuria: baseline creatinine 2.1, GFR of 41.  - renal ultrasound with no obstruction - no recent IV contrast - concern for progression of chronic kidney disease - avoid nephrotoxic agents - holding losartan  - no acute indication for dialysis at this time.   Hypertension with chronic kidney disease: with acute exacerbation of chronic diastolic congestive heart failure.  - home regimen of losartan , hydrochlorothiazide , and amlodipine . However unclear of his compliance.  - IV diuretics as needed  Anemia on  chronic kidney disease: with iron deficiency.  - oral iron supplements - do not recommend IV iron with active infection  Hypokalemia: on hydrochlorothiazide  as outpatient. Magnesium  level replaced.  - continue PO potassium chloride  follow up      LOS: 2 Vallorie Niccoli 12/27/202510:27 AM     [1]  Allergies Allergen Reactions   Yellow Jacket Venom Swelling   "

## 2024-03-08 NOTE — Progress Notes (Signed)
 " Progress Note   Patient: Darrell Campbell FMW:[id number] DOB: 1990/10/28 DOA: 03/06/2024     2 DOS: the patient was seen and examined on 03/08/2024    Brief hospital course:  Darrell Campbell is a 33 y.o. year old male with medical history of hypertension, class III obesity, OSA, iron deficiency anemia presenting to the ED with worsening shortness of breath after having URI symptoms. He states symptoms started Tuesday morning. Then he developed worsening shortness of breath. He had mylagias.  On arrival to the ED patient was noted to be HDS stable.  CBC with significant leukocytosis at 19.2, hemoglobin at baseline consistent with microcytic anemia, CMP with significant hypokalemia at 3.1, significant AKI, proBNP elevated at 3831, troponin elevated at 134 with repeat pending.  Lactic acid normal.  D-dimer elevated.  Blood cultures obtained.  Chest x-ray without any acute findings but did show cardiomegaly and slight volume overload.  CT chest without contrast ordered and pending.  EDP gave patient 1 dose of IV Lasix  80 mg.  Given patient presentation, [facility] contacted for admission.    Other hospital course as noted below:   Assessment and Plan:   Acute hypoxic respiratory failure likely multifactorial in setting of pneumonia.  Acute heart failure with preserved ejection fraction Continue current antibiotic therapy Given BNP of 3831 echocardiogram was obtained that showed EF 70 to 75% with grade 2 diastolic dysfunction Patient received IV Lasix ,  At this time nephrologist would like to hold off Lasix  and observe closely Monitor input and output Continue supplemental oxygen to maintain appropriate saturation   AKI on CKD stage IIIb  Patient with baseline creatinine of 2 however creatinine today of 4.7 Nephrologist have been consulted Monitor renal function closely   Lymphedema of the left lower extremity Doppler ultrasound did not show a clot  Essential hypertension:  Holding  nephrotoxic agents Continue amlodipine  Monitor blood pressure closely   Microcytic anemia:  Hemoglobin at baseline.   Follow-up on iron studies   Hypokalemia:  Continue repletion and monitoring   Vitamin D  deficiency:  Continue vitamin D  supplementation   OSA:  Continue CPAP at night   VTE prophylaxis:  SQ Heparin     Diet: Heart healthy   Code Status:  Full Code    Family Communication: Updated at bedside     Subjective:  Patient seen and examined at bedside this morning Currently requiring 4 L of intranasal oxygen and patient does not use any oxygen at home Agrees to some improvement in respiratory function Denies nausea vomiting   Physical Exam: Gen: NAD, significantly obese male. HENT: NCAT, nasal cannula in place CV: sinus rhythm, tachycardic rate, lower extremity edema L>R Lung: Bibasilar rales but overall poor air movement Abd: No TTP, normal bowel sounds MSK: L extremity edema > Right extremity, Neuro: alert and oriented    Data Reviewed:    Latest Ref Rng & Units 03/08/2024    3:38 AM 03/07/2024    4:15 AM 03/06/2024    9:52 AM  CBC  WBC 4.0 - 10.5 K/uL 11.9  11.5  19.2   Hemoglobin 13.0 - 17.0 g/dL 8.9  9.1  9.4   Hematocrit 39.0 - 52.0 % 30.0  29.2  30.5   Platelets 150 - 400 K/uL 159  154  189        Latest Ref Rng & Units 03/08/2024    3:38 AM 03/07/2024    4:15 AM 03/06/2024    5:52 PM  BMP  Glucose 70 -  99 mg/dL 887  890  884   BUN 6 - 20 mg/dL 44  46  43   Creatinine 0.61 - 1.24 mg/dL 6.05  5.22  5.26   Sodium 135 - 145 mmol/L 138  140  139   Potassium 3.5 - 5.1 mmol/L 3.2  3.2  3.1   Chloride 98 - 111 mmol/L 101  102  101   CO2 22 - 32 mmol/L 24  24  24    Calcium  8.9 - 10.3 mg/dL 8.6  8.5  8.7        Vitals:   03/08/24 1015 03/08/24 1138 03/08/24 1314 03/08/24 1610  BP:  112/86  116/89  Pulse:  84  80  Resp:  19  18  Temp: 100.3 F (37.9 C) 99.1 F (37.3 C) 98.9 F (37.2 C) 99.5 F (37.5 C)  TempSrc: Oral Oral Oral    SpO2:  97%  100%  Weight:      Height:         Author: Drue ONEIDA Potter, MD 03/08/2024 5:35 PM  For on call review [email or web address].  "

## 2024-03-08 NOTE — Plan of Care (Signed)
  Problem: Clinical Measurements: Goal: Cardiovascular complication will be avoided Outcome: Progressing   Problem: Nutrition: Goal: Adequate nutrition will be maintained Outcome: Progressing   Problem: Pain Managment: Goal: General experience of comfort will improve and/or be controlled Outcome: Progressing   Problem: Safety: Goal: Ability to remain free from injury will improve Outcome: Progressing

## 2024-03-09 ENCOUNTER — Inpatient Hospital Stay: Payer: Self-pay

## 2024-03-09 LAB — CBC WITH DIFFERENTIAL/PLATELET
Abs Immature Granulocytes: 0.16 K/uL — ABNORMAL HIGH (ref 0.00–0.07)
Basophils Absolute: 0.1 K/uL (ref 0.0–0.1)
Basophils Relative: 0 %
Eosinophils Absolute: 0.1 K/uL (ref 0.0–0.5)
Eosinophils Relative: 1 %
HCT: 30.9 % — ABNORMAL LOW (ref 39.0–52.0)
Hemoglobin: 9.2 g/dL — ABNORMAL LOW (ref 13.0–17.0)
Immature Granulocytes: 1 %
Lymphocytes Relative: 7 %
Lymphs Abs: 1 K/uL (ref 0.7–4.0)
MCH: 22.7 pg — ABNORMAL LOW (ref 26.0–34.0)
MCHC: 29.8 g/dL — ABNORMAL LOW (ref 30.0–36.0)
MCV: 76.1 fL — ABNORMAL LOW (ref 80.0–100.0)
Monocytes Absolute: 1.4 K/uL — ABNORMAL HIGH (ref 0.1–1.0)
Monocytes Relative: 10 %
Neutro Abs: 11.6 K/uL — ABNORMAL HIGH (ref 1.7–7.7)
Neutrophils Relative %: 81 %
Platelets: 176 K/uL (ref 150–400)
RBC: 4.06 MIL/uL — ABNORMAL LOW (ref 4.22–5.81)
RDW: 17.7 % — ABNORMAL HIGH (ref 11.5–15.5)
WBC: 14.3 K/uL — ABNORMAL HIGH (ref 4.0–10.5)
nRBC: 0 % (ref 0.0–0.2)

## 2024-03-09 LAB — BASIC METABOLIC PANEL WITH GFR
Anion gap: 15 (ref 5–15)
BUN: 44 mg/dL — ABNORMAL HIGH (ref 6–20)
CO2: 23 mmol/L (ref 22–32)
Calcium: 9.4 mg/dL (ref 8.9–10.3)
Chloride: 103 mmol/L (ref 98–111)
Creatinine, Ser: 3.8 mg/dL — ABNORMAL HIGH (ref 0.61–1.24)
GFR, Estimated: 20 mL/min — ABNORMAL LOW
Glucose, Bld: 110 mg/dL — ABNORMAL HIGH (ref 70–99)
Potassium: 3.2 mmol/L — ABNORMAL LOW (ref 3.5–5.1)
Sodium: 141 mmol/L (ref 135–145)

## 2024-03-09 LAB — GLUCOSE, CAPILLARY
Glucose-Capillary: 117 mg/dL — ABNORMAL HIGH (ref 70–99)
Glucose-Capillary: 111 mg/dL — ABNORMAL HIGH (ref 70–99)
Glucose-Capillary: 115 mg/dL — ABNORMAL HIGH (ref 70–99)
Glucose-Capillary: 109 mg/dL — ABNORMAL HIGH (ref 70–99)

## 2024-03-09 MED ORDER — GABAPENTIN 100 MG PO CAPS
100.0000 mg | ORAL_CAPSULE | Freq: Three times a day (TID) | ORAL | Status: DC
  Administered 2024-03-10 – 2024-03-16 (×18): 100 mg via ORAL
  Filled 2024-03-09: qty 1

## 2024-03-09 MED ORDER — ACETAMINOPHEN 10 MG/ML IV SOLN
1000.0000 mg | Freq: Four times a day (QID) | INTRAVENOUS | Status: AC
  Administered 2024-03-09 – 2024-03-10 (×4): 1000 mg via INTRAVENOUS
  Filled 2024-03-09 (×3): qty 100

## 2024-03-09 MED ORDER — AZITHROMYCIN 250 MG PO TABS
500.0000 mg | ORAL_TABLET | Freq: Every day | ORAL | Status: AC
  Administered 2024-03-10 (×2): 500 mg via ORAL

## 2024-03-09 MED ORDER — FUROSEMIDE 10 MG/ML IJ SOLN
20.0000 mg | Freq: Once | INTRAMUSCULAR | Status: AC
  Administered 2024-03-09: 20 mg via INTRAVENOUS
  Filled 2024-03-09: qty 2

## 2024-03-09 MED ORDER — SIMETHICONE 80 MG PO CHEW
80.0000 mg | CHEWABLE_TABLET | Freq: Four times a day (QID) | ORAL | Status: DC
  Administered 2024-03-09 – 2024-03-16 (×26): 80 mg via ORAL
  Filled 2024-03-09 (×2): qty 1

## 2024-03-09 MED ORDER — ACETAMINOPHEN 10 MG/ML IV SOLN: 1000.0000 mg | Freq: Four times a day (QID) | INTRAVENOUS | Status: DC

## 2024-03-09 MED ORDER — GABAPENTIN 100 MG PO CAPS: 100.0000 mg | ORAL_CAPSULE | Freq: Three times a day (TID) | ORAL | Status: DC

## 2024-03-09 MED ORDER — FAMOTIDINE 20 MG PO TABS
20.0000 mg | ORAL_TABLET | Freq: Two times a day (BID) | ORAL | Status: DC
  Administered 2024-03-09 – 2024-03-16 (×13): 20 mg via ORAL
  Filled 2024-03-09: qty 1

## 2024-03-09 MED ORDER — METHOCARBAMOL 500 MG PO TABS: 500.0000 mg | ORAL_TABLET | Freq: Four times a day (QID) | ORAL | Status: DC | PRN

## 2024-03-09 MED ORDER — LABETALOL HCL 5 MG/ML IV SOLN
10.0000 mg | INTRAVENOUS | Status: DC | PRN
  Administered 2024-03-09 – 2024-03-11 (×2): 10 mg via INTRAVENOUS
  Filled 2024-03-09: qty 4

## 2024-03-09 MED ORDER — DICYCLOMINE HCL 20 MG PO TABS
20.0000 mg | ORAL_TABLET | Freq: Three times a day (TID) | ORAL | Status: DC
  Filled 2024-03-09: qty 1

## 2024-03-09 MED ORDER — SODIUM CHLORIDE 0.9 % IV SOLN
12.5000 mg | Freq: Four times a day (QID) | INTRAVENOUS | Status: DC | PRN
  Administered 2024-03-09: 12.5 mg via INTRAVENOUS
  Filled 2024-03-09: qty 0.5
  Filled 2024-03-09: qty 12.5
  Filled 2024-03-09: qty 0.5

## 2024-03-09 MED ORDER — METHOCARBAMOL 1000 MG/10ML IJ SOLN
500.0000 mg | Freq: Three times a day (TID) | INTRAMUSCULAR | Status: DC | PRN
  Administered 2024-03-09 – 2024-03-10 (×3): 500 mg via INTRAVENOUS
  Filled 2024-03-09 (×2): qty 5

## 2024-03-09 MED ORDER — POTASSIUM CHLORIDE CRYS ER 20 MEQ PO TBCR: 40.0000 meq | EXTENDED_RELEASE_TABLET | Freq: Once | ORAL | Status: DC

## 2024-03-09 MED ORDER — POTASSIUM CHLORIDE CRYS ER 20 MEQ PO TBCR
40.0000 meq | EXTENDED_RELEASE_TABLET | Freq: Once | ORAL | Status: AC
  Administered 2024-03-09: 40 meq via ORAL
  Filled 2024-03-09: qty 2

## 2024-03-09 MED ORDER — ALUM & MAG HYDROXIDE-SIMETH 200-200-20 MG/5ML PO SUSP
30.0000 mL | ORAL | Status: DC | PRN
  Administered 2024-03-14 – 2024-03-15 (×2): 30 mL via ORAL

## 2024-03-09 NOTE — Progress Notes (Signed)
 " PROGRESS NOTE    Darrell Campbell  FMW:[id number] DOB: [birth year] DOA: 03/06/2024 PCP: Osker Tinnie HERO, FNP    Brief Narrative:   33 y.o. year old male with medical history of hypertension, class III obesity, OSA, iron deficiency anemia presenting to the ED with worsening shortness of breath after having URI symptoms. He states symptoms started Tuesday morning. Then he developed worsening shortness of breath. He had mylagias. On arrival to the ED patient was noted to be HDS stable. CBC with significant leukocytosis at 19.2, hemoglobin at baseline consistent with microcytic anemia, CMP with significant hypokalemia at 3.1, significant AKI, proBNP elevated at 3831, troponin elevated at 134 with repeat pending. Lactic acid normal. D-dimer elevated. Blood cultures obtained. Chest x-ray without any acute findings but did show cardiomegaly and slight volume overload. CT chest without contrast ordered and pending. EDP gave patient 1 dose of IV Lasix  80 mg. Given patient presentation, [facility] contacted for admission.   Assessment & Plan:   Principal Problem:   Acute hypoxic respiratory failure (HCC) Active Problems:   Essential hypertension   Morbid obesity (HCC)   Iron deficiency anemia   Lymphedema   Prediabetes   OSA (obstructive sleep apnea)   Vitamin D  deficiency   AKI (acute kidney injury)   Hypokalemia  Acute abdominal pain Intractable nausea and vomiting Ileus Patient endorsed abdominal pain on 12/27.  Progressed on 12/28 associated with nausea and vomiting CT abdomen pelvis demonstrates gaseous distention consistent with ileus.  No transition point to suggest obstruction Plan: No indication for NGT at this time Symptomatic treatment of nausea De-escalate to clear liquid diet Discontinue narcotics Mobilize  Acute hypoxic respiratory failure likely multifactorial in setting of pneumonia.  Acute heart failure with preserved ejection fraction Continue current antibiotic  therapy Given BNP of 3831 echocardiogram was obtained that showed EF 70 to 75% with grade 2 diastolic dysfunction Patient received IV Lasix ,  Plan: Hold Lasix  per nephrology recommendations Monitor ins and outs Daily renal function  AKI on CKD stage IIIb  Patient with baseline creatinine of 2 however creatinine on presentation of 4.7 Nephrologist have been consulted Plan: Lasix  on hold Monitor renal function closely   Lymphedema of the left lower extremity Doppler ultrasound did not show a clot Suspect chronic   Essential hypertension:  Holding nephrotoxic agents Continue amlodipine  Monitor blood pressure closely   Microcytic anemia Iron indices indicative of anemia of chronic disease May benefit from iron supplementation however in the setting of ileus will defer at this time   Hypokalemia:  Continue repletion and monitoring   Vitamin D  deficiency:  Continue vitamin D  supplementation   OSA:  Continue CPAP at night   DVT prophylaxis: SQH Code Status: Full Family Communication: None Disposition Plan: Status is: Inpatient Remains inpatient appropriate because: AKI on CKD   Level of care: Progressive  Consultants:  Nephrology  Procedures:  None  Antimicrobials: None   Subjective: Seen and examined.  Complains of abdominal pain nausea and vomiting.  Objective: Vitals:   03/08/24 2353 03/08/24 2353 03/09/24 0807 03/09/24 1144  BP: (!) 160/95 (!) 160/95 (!) 164/93 (!) 154/99  Pulse: 82 85 88 85  Resp: 20 20 20    Temp: 99.2 F (37.3 C) 99.2 F (37.3 C) 98.9 F (37.2 C) 98.1 F (36.7 C)  TempSrc:   Oral Oral  SpO2: (!) 84% 91% 99% 100%  Weight:      Height:        Intake/Output Summary (Last 24 hours) at 03/09/2024  1431 Last data filed at 03/09/2024 0809 Gross per 24 hour  Intake 840 ml  Output 1200 ml  Net -360 ml   Filed Weights   03/06/24 0930 03/07/24 0503 03/08/24 0500  Weight: (!) 174.6 kg (!) 174.6 kg (!) 174.6 kg     Examination:  General exam: Appears uncomfortable Respiratory system: Clear to auscultation. Respiratory effort normal. Cardiovascular system: S1 S2, RRR, no murmurs, no pedal edema Gastrointestinal system: Obese, tense, decreased bowel sounds, nontender to palpation Central nervous system: Alert and oriented. No focal neurological deficits. Extremities: Symmetric 5 x 5 power. Skin: No rashes, lesions or ulcers Psychiatry: Judgement and insight appear normal. Mood & affect appropriate.     Data Reviewed: I have personally reviewed following labs and imaging studies  CBC: Recent Labs  Lab 03/06/24 0952 03/07/24 0415 03/08/24 0338 03/09/24 0356  WBC 19.2* 11.5* 11.9* 14.3*  NEUTROABS  --   --  8.6* 11.6*  HGB 9.4* 9.1* 8.9* 9.2*  HCT 30.5* 29.2* 30.0* 30.9*  MCV 74.9* 74.9* 76.1* 76.1*  PLT 189 154 159 176   Basic Metabolic Panel: Recent Labs  Lab 03/06/24 0952 03/06/24 1533 03/06/24 1752 03/07/24 0415 03/07/24 1753 03/08/24 0338 03/09/24 0356  NA 140  --  139 140  --  138 141  K 3.1*  --  3.1* 3.2*  --  3.2* 3.2*  CL 101  --  101 102  --  101 103  CO2 24  --  24 24  --  24 23  GLUCOSE 126*  --  115* 109*  --  112* 110*  BUN 40*  --  43* 46*  --  44* 44*  CREATININE 4.26*  --  4.73* 4.77*  --  3.94* 3.80*  CALCIUM  9.0  --  8.7* 8.5*  --  8.6* 9.4  MG  --  1.5*  --   --  2.1  --   --   PHOS  --   --   --   --  3.7  --   --    GFR: Estimated Creatinine Clearance: 44.7 mL/min (A) (by C-G formula based on SCr of 3.8 mg/dL (H)). Liver Function Tests: Recent Labs  Lab 03/06/24 9047 03/07/24 1753  AST 60* 38  ALT 25 23  ALKPHOS 78 72  BILITOT 0.7 0.4  PROT 8.1 7.6  ALBUMIN 3.5 3.2*   No results for input(s): LIPASE, AMYLASE in the last 168 hours. No results for input(s): AMMONIA in the last 168 hours. Coagulation Profile: No results for input(s): INR, PROTIME in the last 168 hours. Cardiac Enzymes: No results for input(s): CKTOTAL, CKMB,  CKMBINDEX, TROPONINI in the last 168 hours. BNP (last 3 results) Recent Labs    03/06/24 0952  PROBNP 3,831.0*   HbA1C: Recent Labs    03/07/24 1300  HGBA1C 6.4*   CBG: Recent Labs  Lab 03/08/24 0818 03/08/24 1140 03/08/24 2024 03/09/24 0830 03/09/24 1238  GLUCAP 101* 109* 110* 117* 111*   Lipid Profile: No results for input(s): CHOL, HDL, LDLCALC, TRIG, CHOLHDL, LDLDIRECT in the last 72 hours. Thyroid  Function Tests: Recent Labs    03/06/24 1533  TSH 1.940   Anemia Panel: Recent Labs    03/07/24 1753  VITAMINB12 639  FOLATE 9.5  FERRITIN 553*  TIBC 195*  IRON 14*   Sepsis Labs: Recent Labs  Lab 03/06/24 0952  LATICACIDVEN 1.6    Recent Results (from the past 240 hours)  Resp panel by RT-PCR (RSV, Flu A&B, Covid) Anterior  Nasal Swab     Status: None   Collection Time: 03/06/24  9:26 AM   Specimen: Anterior Nasal Swab  Result Value Ref Range Status   SARS Coronavirus 2 by RT PCR NEGATIVE NEGATIVE Final    Comment: (NOTE) SARS-CoV-2 target nucleic acids are NOT DETECTED.  The SARS-CoV-2 RNA is generally detectable in upper respiratory specimens during the acute phase of infection. The lowest concentration of SARS-CoV-2 viral copies this assay can detect is 138 copies/mL. A negative result does not preclude SARS-Cov-2 infection and should not be used as the sole basis for treatment or other patient management decisions. A negative result may occur with  improper specimen collection/handling, submission of specimen other than nasopharyngeal swab, presence of viral mutation(s) within the areas targeted by this assay, and inadequate number of viral copies(<138 copies/mL). A negative result must be combined with clinical observations, patient history, and epidemiological information. The expected result is Negative.  Fact Sheet for Patients:  bloggercourse.com  Fact Sheet for Healthcare Providers:   seriousbroker.it  This test is no t yet approved or cleared by the [place]  FDA and  has been authorized for detection and/or diagnosis of SARS-CoV-2 by FDA under an Emergency Use Authorization (EUA). This EUA will remain  in effect (meaning this test can be used) for the duration of the COVID-19 declaration under Section 564(b)(1) of the Act, 21 U.S.C.section 360bbb-3(b)(1), unless the authorization is terminated  or revoked sooner.       Influenza A by PCR NEGATIVE NEGATIVE Final   Influenza B by PCR NEGATIVE NEGATIVE Final    Comment: (NOTE) The Xpert Xpress SARS-CoV-2/FLU/RSV plus assay is intended as an aid in the diagnosis of influenza from Nasopharyngeal swab specimens and should not be used as a sole basis for treatment. Nasal washings and aspirates are unacceptable for Xpert Xpress SARS-CoV-2/FLU/RSV testing.  Fact Sheet for Patients: bloggercourse.com  Fact Sheet for Healthcare Providers: seriousbroker.it  This test is not yet approved or cleared by the [place]  FDA and has been authorized for detection and/or diagnosis of SARS-CoV-2 by FDA under an Emergency Use Authorization (EUA). This EUA will remain in effect (meaning this test can be used) for the duration of the COVID-19 declaration under Section 564(b)(1) of the Act, 21 U.S.C. section 360bbb-3(b)(1), unless the authorization is terminated or revoked.     Resp Syncytial Virus by PCR NEGATIVE NEGATIVE Final    Comment: (NOTE) Fact Sheet for Patients: bloggercourse.com  Fact Sheet for Healthcare Providers: seriousbroker.it  This test is not yet approved or cleared by the [place]  FDA and has been authorized for detection and/or diagnosis of SARS-CoV-2 by FDA under an Emergency Use Authorization (EUA). This EUA will remain in effect (meaning this test can be used) for  the duration of the COVID-19 declaration under Section 564(b)(1) of the Act, 21 U.S.C. section 360bbb-3(b)(1), unless the authorization is terminated or revoked.  Performed at [facility], [address] [place], [place] [address]   Blood culture (routine x 2)     Status: None (Preliminary result)   Collection Time: 03/06/24  9:52 AM   Specimen: BLOOD  Result Value Ref Range Status   Specimen Description BLOOD BLOOD RIGHT ARM  Final   Special Requests   Final    BOTTLES DRAWN AEROBIC AND ANAEROBIC Blood Culture results may not be optimal due to an inadequate volume of blood received in culture bottles   Culture   Final    NO GROWTH 3 DAYS Performed at  [facility], [address], [place], [place] [address]    Report Status PENDING  Incomplete  Blood culture (routine x 2)     Status: None (Preliminary result)   Collection Time: 03/06/24  9:52 AM   Specimen: BLOOD  Result Value Ref Range Status   Specimen Description BLOOD BLOOD LEFT HAND  Final   Special Requests   Final    BOTTLES DRAWN AEROBIC AND ANAEROBIC Blood Culture adequate volume   Culture   Final    NO GROWTH 3 DAYS Performed at [facility], [address] [place], [place] [address]    Report Status PENDING  Incomplete         Radiology Studies: CT ABDOMEN PELVIS WO CONTRAST Result Date: 03/09/2024 EXAM: CT ABDOMEN AND PELVIS WITHOUT CONTRAST 03/09/2024 01:33:31 PM TECHNIQUE: CT of the abdomen and pelvis was performed without the administration of intravenous contrast. Multiplanar reformatted images are provided for review. Automated exposure control, iterative reconstruction, and/or weight-based adjustment of the mA/kV was utilized to reduce the radiation dose to as low as reasonably achievable. COMPARISON: None available. CLINICAL HISTORY: Abdominal pain, acute, nonlocalized. FINDINGS: LOWER CHEST: Bibasilar atelectasis versus scarring. LIVER: The liver is unremarkable.  GALLBLADDER AND BILE DUCTS: Status post cholecystectomy. No biliary ductal dilatation. SPLEEN: No acute abnormality. PANCREAS: No acute abnormality. ADRENAL GLANDS: No acute abnormality. KIDNEYS, URETERS AND BLADDER: No stones in the kidneys or ureters. No hydronephrosis. No perinephric or periureteral stranding. Urinary bladder is unremarkable. GI AND BOWEL: The gastric lumen is distended. The mid to distal small bowel are dilated with gas and fluid. The small bowel measures up to 4 cm. Air-fluid levels are noted. No pneumatosis. No bowel thickening. The ascending colon and transverse colon are distended but not dilated with stool and gas. The descending colon and rectosigmoid colon are decompressed. No transition point. There is no bowel obstruction. PERITONEUM AND RETROPERITONEUM: No ascites. No free air. VASCULATURE: Aorta is normal in caliber. LYMPH NODES: No lymphadenopathy. REPRODUCTIVE ORGANS: Prostate unremarkable. BONES AND SOFT TISSUES: No acute osseous abnormality. Small to moderate volume umbilical fat-containing hernia. IMPRESSION: 1. Distended gastric lumen and dilated mid to distal small bowel with air-fluid levels, without pneumatosis or bowel wall thickening; no transition point identified, most consistent with ileus. Limited evaluation on this noncontrast study. 2. Small to moderate fat-containing umbilical hernia. Electronically signed by: Morgane Naveau MD 03/09/2024 02:16 PM EST RP Workstation: [id number]   US  Venous Img Lower Unilateral Left (DVT) Result Date: 03/08/2024 CLINICAL DATA:  33 year old male with left lower extremity pain. EXAM: LEFT LOWER EXTREMITY VENOUS DOPPLER ULTRASOUND TECHNIQUE: Gray-scale sonography with graded compression, as well as color Doppler and duplex ultrasound were performed to evaluate the left lower extremity deep venous systems from the level of the common femoral vein and including the common femoral, femoral, profunda femoral, popliteal and calf veins  including the posterior tibial, peroneal and gastrocnemius veins when visible. Spectral Doppler was utilized to evaluate flow at rest and with distal augmentation maneuvers in the common femoral, femoral and popliteal veins. The contralateral common femoral vein was also evaluated for comparison. COMPARISON:  None Available. FINDINGS: LEFT LOWER EXTREMITY Common Femoral Vein: No evidence of thrombus. Normal compressibility, respiratory phasicity and response to augmentation. Central Greater Saphenous Vein: No evidence of thrombus. Normal compressibility and flow on color Doppler imaging. Central Profunda Femoral Vein: No evidence of thrombus. Normal compressibility and flow on color Doppler imaging. Femoral Vein: No evidence of thrombus. Normal compressibility, respiratory phasicity and response to augmentation.  Popliteal Vein: No evidence of thrombus. Normal compressibility, respiratory phasicity and response to augmentation. Calf Veins: No evidence of thrombus. Normal compressibility and flow on color Doppler imaging. Limited evaluation of the peroneal vein. Other Findings:  None. RIGHT LOWER EXTREMITY Common Femoral Vein: No evidence of thrombus. Normal compressibility, respiratory phasicity and response to augmentation. IMPRESSION: No evidence of left lower extremity deep venous thrombosis. Ester Sides, MD [facility] Electronically Signed   By: Ester Sides M.D.   On: 03/08/2024 15:05   US  RENAL Result Date: 03/08/2024 CLINICAL DATA:  Acute renal failure. EXAM: RENAL / URINARY TRACT ULTRASOUND COMPLETE COMPARISON:  None Available. FINDINGS: Right Kidney: Renal measurements: 10.4 x 5.2 x 5.7 cm = volume: 163 mL. Echogenicity within normal limits. No mass or hydronephrosis visualized. Left Kidney: Renal measurements: 11.5 x 5.2 x 5.4 cm = volume: 170 mL. Echogenicity within normal limits. No mass or hydronephrosis visualized. Bladder: Appears normal  for degree of bladder distention. Other: None. IMPRESSION: No evidence for hydronephrosis. Electronically Signed   By: Camellia Candle M.D.   On: 03/08/2024 05:55        Scheduled Meds:  amLODipine   5 mg Oral Daily   [START ON 03/10/2024] azithromycin   500 mg Oral Daily   famotidine   20 mg Oral BID   heparin   5,000 Units Subcutaneous Q8H   simethicone   80 mg Oral QID   sodium chloride  flush  3 mL Intravenous Q12H   Continuous Infusions:  cefTRIAXone  (ROCEPHIN )  IV 2 g (03/09/24 0045)   promethazine  (PHENERGAN ) injection (IM or IVPB) 12.5 mg (03/09/24 1419)     LOS: 3 days    Calvin KATHEE Robson, MD [facility]   If 7PM-7AM, please contact night-coverage  03/09/2024, 2:31 PM   "

## 2024-03-09 NOTE — Progress Notes (Signed)
 PT Cancellation Note  Patient Details Name: Augustus Zurawski MRN: [id number] DOB: [birth year]   Cancelled Treatment:    Reason Eval/Treat Not Completed: Patient at procedure or test/unavailable  Pt soundly sleeping on multiple AM attempts.  At CT this PM.  Anticipate return tomorrow.   Lauraine Gills 03/09/2024, 1:54 PM

## 2024-03-09 NOTE — Progress Notes (Signed)
 " [facility]  ROUNDING NOTE   Subjective:  Patient reports large BM overnight leading to poor rest. He is very tired today and is drifting to sleep during interview. Reports pain is improved. No other issues.    Objective:  Vital signs in last 24 hours:  Temp:  [97.4 F (36.3 C)-99.5 F (37.5 C)] 98.1 F (36.7 C) (12/28 1144) Pulse Rate:  [80-88] 85 (12/28 1144) Resp:  [18-20] 20 (12/28 0807) BP: (116-164)/(89-99) 154/99 (12/28 1144) SpO2:  [84 %-100 %] 100 % (12/28 1144) FiO2 (%):  [28 %] 28 % (12/27 2019)  Weight change:  Filed Weights   03/06/24 0930 03/07/24 0503 03/08/24 0500  Weight: (!) 174.6 kg (!) 174.6 kg (!) 174.6 kg    Intake/Output: I/O last 3 completed shifts: In: 1240 [P.O.:1240] Out: 2150 [Urine:2150]   Intake/Output this shift:  Total I/O In: 360 [P.O.:360] Out: 500 [Urine:500]  Physical Exam: General: Large body habitus  Head: Normocephalic  Eyes: Anicteric  Neck: Supple  Lungs:  Diminished  Heart: Regular rate   Abdomen:  Soft, nontender  Extremities:  +++ peripheral edema, L>R  Neurologic: alert  Skin: No lesions  Access: None    Basic Metabolic Panel: Recent Labs  Lab 03/06/24 0952 03/06/24 1533 03/06/24 1752 03/07/24 0415 03/07/24 1753 03/08/24 0338 03/09/24 0356  NA 140  --  139 140  --  138 141  K 3.1*  --  3.1* 3.2*  --  3.2* 3.2*  CL 101  --  101 102  --  101 103  CO2 24  --  24 24  --  24 23  GLUCOSE 126*  --  115* 109*  --  112* 110*  BUN 40*  --  43* 46*  --  44* 44*  CREATININE 4.26*  --  4.73* 4.77*  --  3.94* 3.80*  CALCIUM  9.0  --  8.7* 8.5*  --  8.6* 9.4  MG  --  1.5*  --   --  2.1  --   --   PHOS  --   --   --   --  3.7  --   --     Liver Function Tests: Recent Labs  Lab 03/06/24 0952 03/07/24 1753  AST 60* 38  ALT 25 23  ALKPHOS 78 72  BILITOT 0.7 0.4  PROT 8.1 7.6  ALBUMIN 3.5 3.2*   No results for input(s): LIPASE, AMYLASE in the last 168 hours. No results for input(s): AMMONIA  in the last 168 hours.  CBC: Recent Labs  Lab 03/06/24 0952 03/07/24 0415 03/08/24 0338 03/09/24 0356  WBC 19.2* 11.5* 11.9* 14.3*  NEUTROABS  --   --  8.6* 11.6*  HGB 9.4* 9.1* 8.9* 9.2*  HCT 30.5* 29.2* 30.0* 30.9*  MCV 74.9* 74.9* 76.1* 76.1*  PLT 189 154 159 176    Cardiac Enzymes: No results for input(s): CKTOTAL, CKMB, CKMBINDEX, TROPONINI in the last 168 hours.  BNP: Invalid input(s): POCBNP  CBG: Recent Labs  Lab 03/07/24 2136 03/08/24 0818 03/08/24 1140 03/08/24 2024 03/09/24 0830  GLUCAP 127* 101* 109* 110* 117*    Microbiology: Results for orders placed or performed during the hospital encounter of 03/06/24  Resp panel by RT-PCR (RSV, Flu A&B, Covid) Anterior Nasal Swab     Status: None   Collection Time: 03/06/24  9:26 AM   Specimen: Anterior Nasal Swab  Result Value Ref Range Status   SARS Coronavirus 2 by RT PCR NEGATIVE NEGATIVE Final  Comment: (NOTE) SARS-CoV-2 target nucleic acids are NOT DETECTED.  The SARS-CoV-2 RNA is generally detectable in upper respiratory specimens during the acute phase of infection. The lowest concentration of SARS-CoV-2 viral copies this assay can detect is 138 copies/mL. A negative result does not preclude SARS-Cov-2 infection and should not be used as the sole basis for treatment or other patient management decisions. A negative result may occur with  improper specimen collection/handling, submission of specimen other than nasopharyngeal swab, presence of viral mutation(s) within the areas targeted by this assay, and inadequate number of viral copies(<138 copies/mL). A negative result must be combined with clinical observations, patient history, and epidemiological information. The expected result is Negative.  Fact Sheet for Patients:  bloggercourse.com  Fact Sheet for Healthcare Providers:  seriousbroker.it  This test is no t yet approved or  cleared by the [place]  FDA and  has been authorized for detection and/or diagnosis of SARS-CoV-2 by FDA under an Emergency Use Authorization (EUA). This EUA will remain  in effect (meaning this test can be used) for the duration of the COVID-19 declaration under Section 564(b)(1) of the Act, 21 U.S.C.section 360bbb-3(b)(1), unless the authorization is terminated  or revoked sooner.       Influenza A by PCR NEGATIVE NEGATIVE Final   Influenza B by PCR NEGATIVE NEGATIVE Final    Comment: (NOTE) The Xpert Xpress SARS-CoV-2/FLU/RSV plus assay is intended as an aid in the diagnosis of influenza from Nasopharyngeal swab specimens and should not be used as a sole basis for treatment. Nasal washings and aspirates are unacceptable for Xpert Xpress SARS-CoV-2/FLU/RSV testing.  Fact Sheet for Patients: bloggercourse.com  Fact Sheet for Healthcare Providers: seriousbroker.it  This test is not yet approved or cleared by the [place]  FDA and has been authorized for detection and/or diagnosis of SARS-CoV-2 by FDA under an Emergency Use Authorization (EUA). This EUA will remain in effect (meaning this test can be used) for the duration of the COVID-19 declaration under Section 564(b)(1) of the Act, 21 U.S.C. section 360bbb-3(b)(1), unless the authorization is terminated or revoked.     Resp Syncytial Virus by PCR NEGATIVE NEGATIVE Final    Comment: (NOTE) Fact Sheet for Patients: bloggercourse.com  Fact Sheet for Healthcare Providers: seriousbroker.it  This test is not yet approved or cleared by the [place]  FDA and has been authorized for detection and/or diagnosis of SARS-CoV-2 by FDA under an Emergency Use Authorization (EUA). This EUA will remain in effect (meaning this test can be used) for the duration of the COVID-19 declaration under Section 564(b)(1) of the Act, 21  U.S.C. section 360bbb-3(b)(1), unless the authorization is terminated or revoked.  Performed at [facility], [address] [place], [place] [address]   Blood culture (routine x 2)     Status: None (Preliminary result)   Collection Time: 03/06/24  9:52 AM   Specimen: BLOOD  Result Value Ref Range Status   Specimen Description BLOOD BLOOD RIGHT ARM  Final   Special Requests   Final    BOTTLES DRAWN AEROBIC AND ANAEROBIC Blood Culture results may not be optimal due to an inadequate volume of blood received in culture bottles   Culture   Final    NO GROWTH 3 DAYS Performed at [facility], [address] [place], [place] [address]    Report Status PENDING  Incomplete  Blood culture (routine x 2)     Status: None (Preliminary result)   Collection Time: 03/06/24  9:52 AM  Specimen: BLOOD  Result Value Ref Range Status   Specimen Description BLOOD BLOOD LEFT HAND  Final   Special Requests   Final    BOTTLES DRAWN AEROBIC AND ANAEROBIC Blood Culture adequate volume   Culture   Final    NO GROWTH 3 DAYS Performed at [facility], [address] [place], [place] [address]    Report Status PENDING  Incomplete    Coagulation Studies: No results for input(s): LABPROT, INR in the last 72 hours.  Urinalysis: Recent Labs    03/08/24 0152  COLORURINE YELLOW*  LABSPEC 1.013  PHURINE 5.0  GLUCOSEU NEGATIVE  HGBUR MODERATE*  BILIRUBINUR NEGATIVE  KETONESUR NEGATIVE  PROTEINUR 100*  NITRITE NEGATIVE  LEUKOCYTESUR SMALL*      Imaging: US  Venous Img Lower Unilateral Left (DVT) Result Date: 03/08/2024 CLINICAL DATA:  33 year old male with left lower extremity pain. EXAM: LEFT LOWER EXTREMITY VENOUS DOPPLER ULTRASOUND TECHNIQUE: Gray-scale sonography with graded compression, as well as color Doppler and duplex ultrasound were performed to evaluate the left lower extremity deep venous systems from the level of the common femoral vein and including  the common femoral, femoral, profunda femoral, popliteal and calf veins including the posterior tibial, peroneal and gastrocnemius veins when visible. Spectral Doppler was utilized to evaluate flow at rest and with distal augmentation maneuvers in the common femoral, femoral and popliteal veins. The contralateral common femoral vein was also evaluated for comparison. COMPARISON:  None Available. FINDINGS: LEFT LOWER EXTREMITY Common Femoral Vein: No evidence of thrombus. Normal compressibility, respiratory phasicity and response to augmentation. Central Greater Saphenous Vein: No evidence of thrombus. Normal compressibility and flow on color Doppler imaging. Central Profunda Femoral Vein: No evidence of thrombus. Normal compressibility and flow on color Doppler imaging. Femoral Vein: No evidence of thrombus. Normal compressibility, respiratory phasicity and response to augmentation. Popliteal Vein: No evidence of thrombus. Normal compressibility, respiratory phasicity and response to augmentation. Calf Veins: No evidence of thrombus. Normal compressibility and flow on color Doppler imaging. Limited evaluation of the peroneal vein. Other Findings:  None. RIGHT LOWER EXTREMITY Common Femoral Vein: No evidence of thrombus. Normal compressibility, respiratory phasicity and response to augmentation. IMPRESSION: No evidence of left lower extremity deep venous thrombosis. Ester Sides, MD [facility] Electronically Signed   By: Ester Sides M.D.   On: 03/08/2024 15:05   US  RENAL Result Date: 03/08/2024 CLINICAL DATA:  Acute renal failure. EXAM: RENAL / URINARY TRACT ULTRASOUND COMPLETE COMPARISON:  None Available. FINDINGS: Right Kidney: Renal measurements: 10.4 x 5.2 x 5.7 cm = volume: 163 mL. Echogenicity within normal limits. No mass or hydronephrosis visualized. Left Kidney: Renal measurements: 11.5 x 5.2 x 5.4 cm = volume: 170 mL. Echogenicity within  normal limits. No mass or hydronephrosis visualized. Bladder: Appears normal for degree of bladder distention. Other: None. IMPRESSION: No evidence for hydronephrosis. Electronically Signed   By: Camellia Candle M.D.   On: 03/08/2024 05:55     Medications:    cefTRIAXone  (ROCEPHIN )  IV 2 g (03/09/24 0045)    amLODipine   5 mg Oral Daily   [START ON 03/10/2024] azithromycin   500 mg Oral Daily   heparin   5,000 Units Subcutaneous Q8H   sodium chloride  flush  3 mL Intravenous Q12H   acetaminophen  **OR** acetaminophen , HYDROmorphone  (DILAUDID ) injection, ipratropium-albuterol , ondansetron  **OR** ondansetron  (ZOFRAN ) IV, senna-docusate  Assessment/ Plan:  Mr. Darrell Campbell is a 33 y.o.  male  with hypertension, sleep apnea, obesity and anemia, who was  admitted to [facility] on 03/06/2024 for Paroxysmal atrial fibrillation (HCC) [I48.0] SOB (shortness of breath) [R06.02] Upper respiratory tract infection, unspecified type [J06.9] Congestive heart failure, unspecified HF chronicity, unspecified heart failure type (HCC) [I50.9] Acute hypoxic respiratory failure (HCC) [J96.01]    Acute Kidney Injury on chronic kidney disease stage IIIB with proteinuria: baseline creatinine 2.1, GFR of 41. Renal ultrasound shows no obstruction. No recent IV contrast exposure. Concern for progression of chronic kidney disease. Losartan  held. Continuing to monitor renal indices. Slight improvement of creatinine to 3.8 today. Monitor intake and output. No acute need for dialysis at this time.    Lab Results  Component Value Date   CREATININE 3.80 (H) 03/09/2024   CREATININE 3.94 (H) 03/08/2024   CREATININE 4.77 (H) 03/07/2024     Intake/Output Summary (Last 24 hours) at 03/09/2024 1148 Last data filed at 03/09/2024 0809 Gross per 24 hour  Intake 840 ml  Output 1200 ml  Net -360 ml      Hypertension with chronic kidney disease: with acute exacerbation of chronic diastolic congestive heart failure.  Home regimen  of losartan , hydrochlorothiazide , and amlodipine . However unclear of his compliance.  IV diuretics as needed Currently prescribed amlodipine , BP 154/99 today   Anemia on chronic kidney disease: with iron deficiency.  - oral iron supplements - do not recommend IV iron with active infection    Latest Reference Range & Units 03/09/24 03:56  HgB 13.0 - 17.0 g/dL 9.2 (L)  HCT 60.9 - 47.9 % 30.9 (L)  MCV 80.0 - 100.0 fL 76.1 (L)  MCH 26.0 - 34.0 pg 22.7 (L)  MCHC 30.0 - 36.0 g/dL 70.1 (L)  RDW 88.4 - 84.4 % 17.7 (H)    Hypokalemia: on hydrochlorothiazide  as outpatient. Magnesium  level replaced.  - continue PO potassium chloride , follow up. Encourage PO intake         LOS: 3 Saivion Goettel P Jeron Grahn 12/28/202511:47 AM  "

## 2024-03-09 NOTE — Plan of Care (Signed)
Problem: Clinical Measurements: Goal: Cardiovascular complication will be avoided Outcome: Progressing   Problem: Nutrition: Goal: Adequate nutrition will be maintained Outcome: Progressing   Problem: Elimination: Goal: Will not experience complications related to urinary retention Outcome: Progressing   Problem: Safety: Goal: Ability to remain free from injury will improve Outcome: Progressing

## 2024-03-09 NOTE — Progress Notes (Signed)
 OT Cancellation Note  Patient Details Name: Arville Postlewaite MRN: [id number] DOB: 12-19-1990   Cancelled Treatment:    Reason Eval/Treat Not Completed: Patient at procedure or test/ unavailable Pt currently off the unit for CT of abdomen. OT will follow and see as appropriate.   Tijuana Scheidegger L. Tam Savoia, OTR/L  03/09/2024, 1:30 PM

## 2024-03-10 ENCOUNTER — Inpatient Hospital Stay: Payer: Self-pay

## 2024-03-10 LAB — BASIC METABOLIC PANEL WITH GFR
Anion gap: 17 — ABNORMAL HIGH (ref 5–15)
BUN: 56 mg/dL — ABNORMAL HIGH (ref 6–20)
CO2: 23 mmol/L (ref 22–32)
Calcium: 9.9 mg/dL (ref 8.9–10.3)
Chloride: 104 mmol/L (ref 98–111)
Creatinine, Ser: 4.68 mg/dL — ABNORMAL HIGH (ref 0.61–1.24)
GFR, Estimated: 16 mL/min — ABNORMAL LOW
Glucose, Bld: 103 mg/dL — ABNORMAL HIGH (ref 70–99)
Potassium: 3.6 mmol/L (ref 3.5–5.1)
Sodium: 144 mmol/L (ref 135–145)

## 2024-03-10 LAB — KAPPA/LAMBDA LIGHT CHAINS
Kappa free light chain: 69.1 mg/L — ABNORMAL HIGH (ref 3.3–19.4)
Kappa, lambda light chain ratio: 1.19 (ref 0.26–1.65)
Lambda free light chains: 57.9 mg/L — ABNORMAL HIGH (ref 5.7–26.3)

## 2024-03-10 LAB — GLUCOSE, CAPILLARY
Glucose-Capillary: 106 mg/dL — ABNORMAL HIGH (ref 70–99)
Glucose-Capillary: 114 mg/dL — ABNORMAL HIGH (ref 70–99)
Glucose-Capillary: 120 mg/dL — ABNORMAL HIGH (ref 70–99)
Glucose-Capillary: 120 mg/dL — ABNORMAL HIGH (ref 70–99)

## 2024-03-10 MED ORDER — MORPHINE SULFATE (PF) 2 MG/ML IV SOLN
1.0000 mg | Freq: Once | INTRAVENOUS | Status: AC
  Administered 2024-03-10: 1 mg via INTRAVENOUS
  Filled 2024-03-10: qty 1

## 2024-03-10 NOTE — Progress Notes (Signed)
 " [facility]  ROUNDING NOTE   Subjective:   Patient resting quietly in bed NGT in place, dark aspirate  NPO at time of visit No complaints   Creatinine 4.68 (3.8)  Objective:  Vital signs in last 24 hours:  Temp:  [97.8 F (36.6 C)-98.6 F (37 C)] 97.8 F (36.6 C) (12/29 1200) Pulse Rate:  [79-85] 80 (12/29 1200) Resp:  [17-25] 17 (12/29 0404) BP: (150-177)/(79-113) 151/79 (12/29 1200) SpO2:  [95 %-100 %] 96 % (12/29 1200) Weight:  [174.6 kg] 174.6 kg (12/29 0500)  Weight change:  Filed Weights   03/07/24 0503 03/08/24 0500 03/10/24 0500  Weight: (!) 174.6 kg (!) 174.6 kg (!) 174.6 kg    Intake/Output: I/O last 3 completed shifts: In: 360 [P.O.:360] Out: 2250 [Urine:1200; Emesis/NG output:1050]   Intake/Output this shift:  No intake/output data recorded.  Physical Exam: General: Large body habitus  Head: Normocephalic, NGT  Eyes: Anicteric  Lungs:  Diminished  Heart: Regular rate   Abdomen:  Soft, nontender, obese  Extremities:  +++ peripheral edema, L>R  Neurologic: alert  Skin: No lesions  Access: None    Basic Metabolic Panel: Recent Labs  Lab 03/06/24 1533 03/06/24 1752 03/07/24 0415 03/07/24 1753 03/08/24 0338 03/09/24 0356 03/10/24 0442  NA  --  139 140  --  138 141 144  K  --  3.1* 3.2*  --  3.2* 3.2* 3.6  CL  --  101 102  --  101 103 104  CO2  --  24 24  --  24 23 23   GLUCOSE  --  115* 109*  --  112* 110* 103*  BUN  --  43* 46*  --  44* 44* 56*  CREATININE  --  4.73* 4.77*  --  3.94* 3.80* 4.68*  CALCIUM   --  8.7* 8.5*  --  8.6* 9.4 9.9  MG 1.5*  --   --  2.1  --   --   --   PHOS  --   --   --  3.7  --   --   --     Liver Function Tests: Recent Labs  Lab 03/06/24 0952 03/07/24 1753  AST 60* 38  ALT 25 23  ALKPHOS 78 72  BILITOT 0.7 0.4  PROT 8.1 7.6  ALBUMIN 3.5 3.2*   No results for input(s): LIPASE, AMYLASE in the last 168 hours. No results for input(s): AMMONIA in the last 168 hours.  CBC: Recent  Labs  Lab 03/06/24 0952 03/07/24 0415 03/08/24 0338 03/09/24 0356  WBC 19.2* 11.5* 11.9* 14.3*  NEUTROABS  --   --  8.6* 11.6*  HGB 9.4* 9.1* 8.9* 9.2*  HCT 30.5* 29.2* 30.0* 30.9*  MCV 74.9* 74.9* 76.1* 76.1*  PLT 189 154 159 176    Cardiac Enzymes: No results for input(s): CKTOTAL, CKMB, CKMBINDEX, TROPONINI in the last 168 hours.  BNP: Invalid input(s): POCBNP  CBG: Recent Labs  Lab 03/09/24 1238 03/09/24 1644 03/09/24 2151 03/10/24 0824 03/10/24 1159  GLUCAP 111* 115* 109* 106* 114*    Microbiology: Results for orders placed or performed during the hospital encounter of 03/06/24  Resp panel by RT-PCR (RSV, Flu A&B, Covid) Anterior Nasal Swab     Status: None   Collection Time: 03/06/24  9:26 AM   Specimen: Anterior Nasal Swab  Result Value Ref Range Status   SARS Coronavirus 2 by RT PCR NEGATIVE NEGATIVE Final    Comment: (NOTE) SARS-CoV-2 target nucleic acids are NOT DETECTED.  The SARS-CoV-2 RNA is generally detectable in upper respiratory specimens during the acute phase of infection. The lowest concentration of SARS-CoV-2 viral copies this assay can detect is 138 copies/mL. A negative result does not preclude SARS-Cov-2 infection and should not be used as the sole basis for treatment or other patient management decisions. A negative result may occur with  improper specimen collection/handling, submission of specimen other than nasopharyngeal swab, presence of viral mutation(s) within the areas targeted by this assay, and inadequate number of viral copies(<138 copies/mL). A negative result must be combined with clinical observations, patient history, and epidemiological information. The expected result is Negative.  Fact Sheet for Patients:  bloggercourse.com  Fact Sheet for Healthcare Providers:  seriousbroker.it  This test is no t yet approved or cleared by the [place]  FDA and  has  been authorized for detection and/or diagnosis of SARS-CoV-2 by FDA under an Emergency Use Authorization (EUA). This EUA will remain  in effect (meaning this test can be used) for the duration of the COVID-19 declaration under Section 564(b)(1) of the Act, 21 U.S.C.section 360bbb-3(b)(1), unless the authorization is terminated  or revoked sooner.       Influenza A by PCR NEGATIVE NEGATIVE Final   Influenza B by PCR NEGATIVE NEGATIVE Final    Comment: (NOTE) The Xpert Xpress SARS-CoV-2/FLU/RSV plus assay is intended as an aid in the diagnosis of influenza from Nasopharyngeal swab specimens and should not be used as a sole basis for treatment. Nasal washings and aspirates are unacceptable for Xpert Xpress SARS-CoV-2/FLU/RSV testing.  Fact Sheet for Patients: bloggercourse.com  Fact Sheet for Healthcare Providers: seriousbroker.it  This test is not yet approved or cleared by the [place]  FDA and has been authorized for detection and/or diagnosis of SARS-CoV-2 by FDA under an Emergency Use Authorization (EUA). This EUA will remain in effect (meaning this test can be used) for the duration of the COVID-19 declaration under Section 564(b)(1) of the Act, 21 U.S.C. section 360bbb-3(b)(1), unless the authorization is terminated or revoked.     Resp Syncytial Virus by PCR NEGATIVE NEGATIVE Final    Comment: (NOTE) Fact Sheet for Patients: bloggercourse.com  Fact Sheet for Healthcare Providers: seriousbroker.it  This test is not yet approved or cleared by the [place]  FDA and has been authorized for detection and/or diagnosis of SARS-CoV-2 by FDA under an Emergency Use Authorization (EUA). This EUA will remain in effect (meaning this test can be used) for the duration of the COVID-19 declaration under Section 564(b)(1) of the Act, 21 U.S.C. section 360bbb-3(b)(1), unless the  authorization is terminated or revoked.  Performed at [facility], [address] [place], [place] [address]   Blood culture (routine x 2)     Status: None (Preliminary result)   Collection Time: 03/06/24  9:52 AM   Specimen: BLOOD  Result Value Ref Range Status   Specimen Description BLOOD BLOOD RIGHT ARM  Final   Special Requests   Final    BOTTLES DRAWN AEROBIC AND ANAEROBIC Blood Culture results may not be optimal due to an inadequate volume of blood received in culture bottles   Culture   Final    NO GROWTH 4 DAYS Performed at [facility], [address] [place], [place] [address]    Report Status PENDING  Incomplete  Blood culture (routine x 2)     Status: None (Preliminary result)   Collection Time: 03/06/24  9:52 AM   Specimen: BLOOD  Result Value Ref Range Status  Specimen Description BLOOD BLOOD LEFT HAND  Final   Special Requests   Final    BOTTLES DRAWN AEROBIC AND ANAEROBIC Blood Culture adequate volume   Culture   Final    NO GROWTH 4 DAYS Performed at [facility], [address] [place], [place] [address]    Report Status PENDING  Incomplete    Coagulation Studies: No results for input(s): LABPROT, INR in the last 72 hours.  Urinalysis: Recent Labs    03/08/24 0152  COLORURINE YELLOW*  LABSPEC 1.013  PHURINE 5.0  GLUCOSEU NEGATIVE  HGBUR MODERATE*  BILIRUBINUR NEGATIVE  KETONESUR NEGATIVE  PROTEINUR 100*  NITRITE NEGATIVE  LEUKOCYTESUR SMALL*      Imaging: DG Abd 1 View Result Date: 03/10/2024 EXAM: 1 VIEW XRAY OF THE ABDOMEN 03/10/2024 07:13:00 AM COMPARISON: 03/09/2024 CLINICAL HISTORY: Ileus (HCC) 01250 FINDINGS: LINES, TUBES AND DEVICES: Enteric tube in place with tip and side port projecting over the gastric body. BOWEL: Gaseous distension of loops of small bowel and colon. SOFT TISSUES: No abnormal calcifications. BONES: No acute fracture. IMPRESSION: 1. Enteric tube in place with marked gaseous  distension of small bowel and colon. Electronically signed by: Michaeline Blanch MD 03/10/2024 11:18 AM EST RP Workstation: [id number]   DG Abd 1 View Result Date: 03/09/2024 EXAM: 1 VIEW XRAY OF THE ABDOMEN 03/09/2024 09:49:09 PM COMPARISON: None available. CLINICAL HISTORY: Encounter for nasogastric (NG) tube placement FINDINGS: LINES, TUBES AND DEVICES: Enteric tube in place with distal tip and side port terminating within the expected location of the stomach. BOWEL: Dilated loops of small bowel in visualized upper abdomen. SOFT TISSUES: No abnormal calcifications. BONES: No acute fracture. IMPRESSION: 1. Dilated loops of small bowel in the visualized upper abdomen. 2. Enteric tube in place with distal tip and side port terminating within the expected location of the stomach. Electronically signed by: Greig Pique MD 03/09/2024 10:20 PM EST RP Workstation: [id number]   DG Chest 1 View Result Date: 03/09/2024 EXAM: 1 VIEW(S) XRAY OF THE CHEST 03/09/2024 09:49:09 PM COMPARISON: 03/06/2024 CLINICAL HISTORY: Encounter for nasogastric (NG) tube placement FINDINGS: LINES, TUBES AND DEVICES: The nasogastric tube tip overlies the upper midline mediastinum. LUNGS AND PLEURA: Low lung volume. Mild pulmonary edema, possibly accentuated by low lung volume. No focal pulmonary opacity. No pleural effusion. No pneumothorax. HEART AND MEDIASTINUM: Stable moderate cardiomegaly. The nasogastric tube tip overlies the upper midline mediastinum. BONES AND SOFT TISSUES: No acute osseous abnormality. IMPRESSION: 1. Enteric tube tip projects over the upper midline mediastinum; recommend advancement and repeat radiograph to confirm gastric positioning. 2. Mild pulmonary edema, possibly accentuated by low lung volume. 3. Stable moderate cardiomegaly. Electronically signed by: Greig Pique MD 03/09/2024 10:19 PM EST RP Workstation: [id number]   CT ABDOMEN PELVIS WO CONTRAST Result Date: 03/09/2024 EXAM: CT ABDOMEN AND PELVIS WITHOUT  CONTRAST 03/09/2024 01:33:31 PM TECHNIQUE: CT of the abdomen and pelvis was performed without the administration of intravenous contrast. Multiplanar reformatted images are provided for review. Automated exposure control, iterative reconstruction, and/or weight-based adjustment of the mA/kV was utilized to reduce the radiation dose to as low as reasonably achievable. COMPARISON: None available. CLINICAL HISTORY: Abdominal pain, acute, nonlocalized. FINDINGS: LOWER CHEST: Bibasilar atelectasis versus scarring. LIVER: The liver is unremarkable. GALLBLADDER AND BILE DUCTS: Status post cholecystectomy. No biliary ductal dilatation. SPLEEN: No acute abnormality. PANCREAS: No acute abnormality. ADRENAL GLANDS: No acute abnormality. KIDNEYS, URETERS AND BLADDER: No stones in the kidneys or ureters. No hydronephrosis. No perinephric or periureteral stranding. Urinary bladder is unremarkable. GI  AND BOWEL: The gastric lumen is distended. The mid to distal small bowel are dilated with gas and fluid. The small bowel measures up to 4 cm. Air-fluid levels are noted. No pneumatosis. No bowel thickening. The ascending colon and transverse colon are distended but not dilated with stool and gas. The descending colon and rectosigmoid colon are decompressed. No transition point. There is no bowel obstruction. PERITONEUM AND RETROPERITONEUM: No ascites. No free air. VASCULATURE: Aorta is normal in caliber. LYMPH NODES: No lymphadenopathy. REPRODUCTIVE ORGANS: Prostate unremarkable. BONES AND SOFT TISSUES: No acute osseous abnormality. Small to moderate volume umbilical fat-containing hernia. IMPRESSION: 1. Distended gastric lumen and dilated mid to distal small bowel with air-fluid levels, without pneumatosis or bowel wall thickening; no transition point identified, most consistent with ileus. Limited evaluation on this noncontrast study. 2. Small to moderate fat-containing umbilical hernia. Electronically signed by: Morgane Naveau MD  03/09/2024 02:16 PM EST RP Workstation: [id number]     Medications:    acetaminophen  1,000 mg (03/10/24 0900)   cefTRIAXone  (ROCEPHIN )  IV 2 g (03/09/24 2341)   promethazine  (PHENERGAN ) injection (IM or IVPB) 12.5 mg (03/09/24 1419)    amLODipine   5 mg Oral Daily   azithromycin   500 mg Oral Daily   famotidine   20 mg Oral BID   gabapentin   100 mg Oral TID   heparin   5,000 Units Subcutaneous Q8H   simethicone   80 mg Oral QID   sodium chloride  flush  3 mL Intravenous Q12H   acetaminophen  **OR** acetaminophen , alum & mag hydroxide-simeth, ipratropium-albuterol , labetalol , methocarbamol  (ROBAXIN ) injection, ondansetron  **OR** ondansetron  (ZOFRAN ) IV, promethazine  (PHENERGAN ) injection (IM or IVPB), senna-docusate  Assessment/ Plan:  Darrell Campbell is a 33 y.o.  male  with hypertension, sleep apnea, obesity and anemia, who was admitted to [facility] on 03/06/2024 for Paroxysmal atrial fibrillation (HCC) [I48.0] SOB (shortness of breath) [R06.02] Upper respiratory tract infection, unspecified type [J06.9] Congestive heart failure, unspecified HF chronicity, unspecified heart failure type (HCC) [I50.9] Acute hypoxic respiratory failure (HCC) [J96.01]    Acute Kidney Injury on chronic kidney disease stage IIIB with proteinuria: baseline creatinine 2.1, GFR of 41. Renal ultrasound shows no obstruction. No recent IV contrast exposure. Concern for progression of chronic kidney disease. Losartan  held.  Creatinine increased today, likely due to illeus. NGT with 1L drainage. Will continue to monitor.    Lab Results  Component Value Date   CREATININE 4.68 (H) 03/10/2024   CREATININE 3.80 (H) 03/09/2024   CREATININE 3.94 (H) 03/08/2024     Intake/Output Summary (Last 24 hours) at 03/10/2024 1612 Last data filed at 03/10/2024 0500 Gross per 24 hour  Intake --  Output 1050 ml  Net -1050 ml      Hypertension with chronic kidney disease: with acute exacerbation of chronic diastolic  congestive heart failure.  Home regimen of losartan , hydrochlorothiazide , and amlodipine . However unclear of his compliance.  IV diuretics as needed IV furosemide  20mg  given yesterday. Remains on amlodipine . Blood pressure elevated but stable   Anemia on chronic kidney disease: with iron deficiency.  - oral iron supplements - do not recommend IV iron with active infection    Latest Reference Range & Units 03/09/24 03:56  HgB 13.0 - 17.0 g/dL 9.2 (L)  HCT 60.9 - 47.9 % 30.9 (L)  MCV 80.0 - 100.0 fL 76.1 (L)  MCH 26.0 - 34.0 pg 22.7 (L)  MCHC 30.0 - 36.0 g/dL 70.1 (L)  RDW 88.4 - 84.4 % 17.7 (H)    Hypokalemia: on hydrochlorothiazide   as outpatient. Magnesium  level replaced.  - Potassium 3.6, corrected with oral supplementation.          LOS: 4 Darrell Campbell 12/29/20254:12 PM  "

## 2024-03-10 NOTE — Evaluation (Signed)
 Occupational Therapy Evaluation Patient Details Name: Darrell Campbell MRN: [id number] DOB: 06-Jul-1990 Today's Date: 03/10/2024   History of Present Illness   Darrell Campbell is a 33yoM who comes [facility] on 12/25 with 3 days URI symptoms, LLE edema. PMH: HTN, OSA, hypoferritinemia.  BNP: 3831, pt febrile, RVP negative.     Clinical Impressions Patient presenting with decreased Ind in self care, balance, functional mobility, transfers, endurance, and safety awareness. Pt lives at home and reports being Ind at baseline. He lives with his mother. Patient on O2 at time of evaluation. He needs mod A of 2 for bed mobility to come to EOB. Static sitting balance with supervision. Pt stands with use of RW and min A of 2 and is able to take several side steps with min A of 2. Pt standing for several minutes and then sitting to take several bites of italian ice before rocking to gain momentum to perform sit >supine without assistance. Pt making excellent progress in terms of OOB activity this session. Patient will benefit from acute OT to increase overall independence in the areas of ADLs, functional mobility, and safety awareness in order to safely discharge.     If plan is discharge home, recommend the following:   A lot of help with walking and/or transfers;A lot of help with bathing/dressing/bathroom;Assistance with cooking/housework;Help with stairs or ramp for entrance;Assist for transportation;Direct supervision/assist for financial management;Direct supervision/assist for medications management     Functional Status Assessment   Patient has had a recent decline in their functional status and demonstrates the ability to make significant improvements in function in a reasonable and predictable amount of time.     Equipment Recommendations   Other (comment) (bariatric RW and BSC)      Precautions/Restrictions   Precautions Precautions: Fall Recall of Precautions/Restrictions:  Intact     Mobility Bed Mobility Overal bed mobility: Needs Assistance Bed Mobility: Supine to Sit, Sit to Supine     Supine to sit: +2 for physical assistance, Mod assist Sit to supine: Supervision        Transfers Overall transfer level: Needs assistance Equipment used: Rolling walker (2 wheels) Transfers: Sit to/from Stand Sit to Stand: Min assist, +2 safety/equipment                  Balance Overall balance assessment: Needs assistance Sitting-balance support: Feet supported Sitting balance-Leahy Scale: Good     Standing balance support: Reliant on assistive device for balance, During functional activity, Bilateral upper extremity supported Standing balance-Leahy Scale: Fair                             ADL either performed or assessed with clinical judgement   ADL Overall ADL's : Needs assistance/impaired     Grooming: Sitting;Set up;Wash/dry hands;Wash/dry face                               Functional mobility during ADLs: Minimal assistance;+2 for physical assistance       Vision Baseline Vision/History: 1 Wears glasses Ability to See in Adequate Light: 0 Adequate              Pertinent Vitals/Pain Pain Assessment Pain Assessment: 0-10 Pain Score: 7  Pain Location: Left foot pain Pain Descriptors / Indicators: Burning, Discomfort, Moaning, Tender Pain Intervention(s): Limited activity within patient's tolerance, Monitored during session, Repositioned, Premedicated before session  Extremity/Trunk Assessment Upper Extremity Assessment Upper Extremity Assessment: Generalized weakness   Lower Extremity Assessment Lower Extremity Assessment: Defer to PT evaluation;LLE deficits/detail LLE Deficits / Details: lymphadema       Communication Communication Communication: No apparent difficulties   Cognition Arousal: Alert Behavior During Therapy: WFL for tasks assessed/performed Cognition: No apparent  impairments                               Following commands: Intact       Cueing  General Comments   Cueing Techniques: Verbal cues              Home Living Family/patient expects to be discharged to:: Private residence Living Arrangements: Parent Available Help at Discharge: Family;Available PRN/intermittently Type of Home: House Home Access: Level entry     Home Layout: Two level;1/2 bath on main level;Bed/bath upstairs;Able to live on main level with bedroom/bathroom Alternate Level Stairs-Number of Steps: 13   Bathroom Shower/Tub: Tub/shower unit         Home Equipment: None          Prior Functioning/Environment Prior Level of Function : Independent/Modified Independent                    OT Problem List: Decreased strength;Decreased safety awareness;Decreased activity tolerance;Impaired balance (sitting and/or standing);Cardiopulmonary status limiting activity   OT Treatment/Interventions: Self-care/ADL training;Therapeutic activities;Therapeutic exercise;Patient/family education;Energy conservation;Balance training;DME and/or AE instruction;Manual therapy;Modalities      OT Goals(Current goals can be found in the care plan section)   Acute Rehab OT Goals Patient Stated Goal: to go home OT Goal Formulation: With patient Time For Goal Achievement: 03/24/24 Potential to Achieve Goals: Fair ADL Goals Pt Will Perform Grooming: sitting;with modified independence Pt Will Perform Lower Body Dressing: with modified independence;sit to/from stand;with adaptive equipment Pt Will Transfer to Toilet: with modified independence;ambulating Pt Will Perform Toileting - Clothing Manipulation and hygiene: with modified independence;sit to/from stand   OT Frequency:  Min 2X/week       AM-PAC OT 6 Clicks Daily Activity     Outcome Measure Help from another person eating meals?: None Help from another person taking care of personal  grooming?: A Little Help from another person toileting, which includes using toliet, bedpan, or urinal?: A Lot Help from another person bathing (including washing, rinsing, drying)?: A Lot Help from another person to put on and taking off regular upper body clothing?: A Little Help from another person to put on and taking off regular lower body clothing?: A Lot 6 Click Score: 16   End of Session Equipment Utilized During Treatment: Rolling walker (2 wheels);Oxygen Nurse Communication: Mobility status  Activity Tolerance: Patient tolerated treatment well Patient left: in bed;with call bell/phone within reach;with bed alarm set  OT Visit Diagnosis: Unsteadiness on feet (R26.81);Repeated falls (R29.6);Muscle weakness (generalized) (M62.81)                Time: 8887-8861 OT Time Calculation (min): 26 min Charges:  OT General Charges $OT Visit: 1 Visit OT Evaluation $OT Eval Moderate Complexity: 1 [address] [place], OTR/L , [facility] ascom [phone]  03/10/2024, 2:06 PM

## 2024-03-10 NOTE — Plan of Care (Signed)

## 2024-03-10 NOTE — Progress Notes (Signed)
 Physical Therapy Treatment Patient Details Name: Darrell Campbell MRN: [id number] DOB: November 19, 1990 Today's Date: 03/10/2024   History of Present Illness Darrell Campbell is a 33yoM who comes to [facility] on 12/25 with 3 days URI symptoms, LLE edema. PMH: HTN, OSA, hypoferritinemia.  BNP: 3831, pt febrile, RVP negative.    PT Comments  Called by nursing for assist transferring beds.  He is able to get to EOB with mod a x 2.  Stands to RW with min a x 2 to RW where he stands at sink while beds are swapped out to a Bari bed  He takes a rest on EOB before needing to use BSC.  BSC is obtained and he is able to transfer on/off for large loose BM.  RN in to assist with care before transferring back to bed.  He opts to sit EOB with tech in room for a while before laying back down.    Overall did well with mobility.  Pt educated on plan to progress gait tomorrow and time OOB in recliner and voices understanding/agreement.   If plan is discharge home, recommend the following: Two people to help with walking and/or transfers;Help with stairs or ramp for entrance;A lot of help with bathing/dressing/bathroom   Can travel by private vehicle        Equipment Recommendations  Rolling walker (2 wheels) (bariatric)    Recommendations for Other Services       Precautions / Restrictions Precautions Precautions: Fall Recall of Precautions/Restrictions: Intact Restrictions Weight Bearing Restrictions Per Provider Order: No     Mobility  Bed Mobility Overal bed mobility: Needs Assistance Bed Mobility: Supine to Sit     Supine to sit: Mod assist, +2 for physical assistance       Patient Response: Cooperative  Transfers Overall transfer level: Needs assistance Equipment used: Rolling walker (2 wheels) Transfers: Sit to/from Stand Sit to Stand: Min assist, +2 safety/equipment                Ambulation/Gait Ambulation/Gait assistance: Contact guard assist, +2 safety/equipment Gait Distance  (Feet): 4 Feet Assistive device: Rolling walker (2 wheels) Gait Pattern/deviations: Step-to pattern Gait velocity: dec     General Gait Details: sidesteps along counter and to/from [facility]   Stairs             Wheelchair Mobility     Tilt Bed Tilt Bed Patient Response: Cooperative  Modified Rankin (Stroke Patients Only)       Balance Overall balance assessment: Needs assistance Sitting-balance support: Feet supported Sitting balance-Leahy Scale: Good     Standing balance support: Reliant on assistive device for balance, During functional activity, Bilateral upper extremity supported Standing balance-Leahy Scale: Fair                              Hotel Manager: No apparent difficulties  Cognition Arousal: Alert Behavior During Therapy: WFL for tasks assessed/performed   PT - Cognitive impairments: No apparent impairments                         Following commands: Intact      Cueing Cueing Techniques: Verbal cues  Exercises      General Comments        Pertinent Vitals/Pain Pain Assessment Pain Assessment: Faces Faces Pain Scale: Hurts even more Pain Location: Left foot pain, abdomen Pain Descriptors / Indicators: Burning, Discomfort, Moaning, Tender Pain Intervention(s):  Monitored during session, Patient requesting pain meds-RN notified    Home Living Family/patient expects to be discharged to:: Private residence Living Arrangements: Parent Available Help at Discharge: Family;Available PRN/intermittently Type of Home: House Home Access: Level entry     Alternate Level Stairs-Number of Steps: 13 Home Layout: Two level;1/2 bath on main level;Bed/bath upstairs;Able to live on main level with bedroom/bathroom Home Equipment: None      Prior Function            PT Goals (current goals can now be found in the care plan section) Progress towards PT goals: Progressing toward goals     Frequency    Min 2X/week      PT Plan      Co-evaluation              AM-PAC PT 6 Clicks Mobility   Outcome Measure  Help needed turning from your back to your side while in a flat bed without using bedrails?: A Lot Help needed moving from lying on your back to sitting on the side of a flat bed without using bedrails?: A Lot Help needed moving to and from a bed to a chair (including a wheelchair)?: A Little Help needed standing up from a chair using your arms (e.g., wheelchair or bedside chair)?: A Little Help needed to walk in hospital room?: A Little Help needed climbing 3-5 steps with a railing? : A Lot 6 Click Score: 15    End of Session Equipment Utilized During Treatment: Oxygen Activity Tolerance: Patient tolerated treatment well Patient left: in bed;with call bell/phone within reach;with nursing/sitter in room Nurse Communication: Mobility status PT Visit Diagnosis: Unsteadiness on feet (R26.81);Other abnormalities of gait and mobility (R26.89);Muscle weakness (generalized) (M62.81)     Time: 8542-8466 PT Time Calculation (min) (ACUTE ONLY): 36 min  Charges:    $Therapeutic Activity: 23-37 mins PT General Charges $$ ACUTE PT VISIT: 1 Visit                   Lauraine Gills, PTA 03/10/2024, 3:38 PM

## 2024-03-10 NOTE — TOC Initial Note (Signed)
 Transition of Care [facility]) - Initial/Assessment Note    Patient Details  Name: Darrell Campbell MRN: [id number] Date of Birth: [birth year]  Transition of Care [facility] CM/SW Contact:    Alfonso Rummer, LCSW Phone Number: 03/10/2024, 4:13 PM  Clinical Narrative:                  KEN DELENA Rummer met with pt at bedside in room 236. Pt informed of recommendations and reports he will discharge home when he is medically ready. Pt is receptive to home health services. IVAR Gin with [facility] business office reports pt was referred to [employer] to be screened for [facility] Medicaid. Attending physician aware of patient decision to discharge home once he is medically ready.        Patient Goals and CMS Choice            Expected Discharge Plan and Services                                              Prior Living Arrangements/Services                       Activities of Daily Living   ADL Screening (condition at time of admission) Independently performs ADLs?: Yes (appropriate for developmental age) Is the patient deaf or have difficulty hearing?: No Does the patient have difficulty seeing, even when wearing glasses/contacts?: No Does the patient have difficulty concentrating, remembering, or making decisions?: No  Permission Sought/Granted                  Emotional Assessment              Admission diagnosis:  Paroxysmal atrial fibrillation (HCC) [I48.0] SOB (shortness of breath) [R06.02] Upper respiratory tract infection, unspecified type [J06.9] Congestive heart failure, unspecified HF chronicity, unspecified heart failure type (HCC) [I50.9] Acute hypoxic respiratory failure (HCC) [J96.01] Patient Active Problem List   Diagnosis Date Noted   Acute hypoxic respiratory failure (HCC) 03/06/2024   Hypokalemia 03/06/2024   Vitamin D  deficiency 10/04/2020   Anemia 10/04/2020   AKI (acute kidney injury) 10/04/2020   Lymphedema 09/30/2020   Prediabetes  09/30/2020   Hypertension 09/30/2020   Morbid obesity (HCC) 09/30/2020   OSA (obstructive sleep apnea) 09/30/2020   Venous stasis dermatitis of both lower extremities 09/30/2020   Iron deficiency anemia 11/19/2018   Essential hypertension 07/30/2018   Morbid obesity (HCC) 07/30/2018   PCP:  Osker Tinnie HERO, FNP Pharmacy:   [id number] - [address]), [place] - [address] [place] ([place]) [place] [address] Phone: [phone] Fax: [phone]  [id number] [place], [place] - [address] AT [facility] [address] [place] [address] [place] [address] Phone: [phone] Fax: [phone]     Social Drivers of Health (SDOH) Social History: SDOH Screenings   Food Insecurity: No Food Insecurity (03/06/2024)  Housing: Unknown (03/06/2024)  Transportation Needs: No Transportation Needs (03/06/2024)  Utilities: Not At Risk (03/06/2024)  Social Connections: Patient Declined (03/06/2024)  Tobacco Use: Low Risk (03/06/2024)   SDOH Interventions:     Readmission Risk Interventions     No data to display

## 2024-03-10 NOTE — Progress Notes (Signed)
 " PROGRESS NOTE    Lles ELIGH RYBACKI  FMW:[id number] DOB: 1990/09/10 DOA: 03/06/2024 PCP: Osker Tinnie HERO, FNP    Brief Narrative:   33 y.o. year old male with medical history of hypertension, class III obesity, OSA, iron deficiency anemia presenting to the ED with worsening shortness of breath after having URI symptoms. He states symptoms started Tuesday morning. Then he developed worsening shortness of breath. He had mylagias. On arrival to the ED patient was noted to be HDS stable. CBC with significant leukocytosis at 19.2, hemoglobin at baseline consistent with microcytic anemia, CMP with significant hypokalemia at 3.1, significant AKI, proBNP elevated at 3831, troponin elevated at 134 with repeat pending. Lactic acid normal. D-dimer elevated. Blood cultures obtained. Chest x-ray without any acute findings but did show cardiomegaly and slight volume overload. CT chest without contrast ordered and pending. EDP gave patient 1 dose of IV Lasix  80 mg. Given patient presentation, [facility] contacted for admission.   Assessment & Plan:   Principal Problem:   Acute hypoxic respiratory failure (HCC) Active Problems:   Essential hypertension   Morbid obesity (HCC)   Iron deficiency anemia   Lymphedema   Prediabetes   OSA (obstructive sleep apnea)   Vitamin D  deficiency   AKI (acute kidney injury)   Hypokalemia  Acute abdominal pain Intractable nausea and vomiting Ileus Patient endorsed abdominal pain on 12/27.  Progressed on 12/28 associated with nausea and vomiting.  CT abdomen pelvis demonstrates gaseous distention consistent with ileus.  No transition point to suggest obstruction.  Patient had projectile vomiting on 12/20 8 PM.  NGT placed for gastric decompression and patient feels better. Plan: Continue NGT set to LIS Clear liquid diet Antiemetics Repeat KUB in a.m. No narcotics Mobilize  Acute hypoxic respiratory failure likely multifactorial in setting of pneumonia.  Acute heart  failure with preserved ejection fraction Continue current antibiotic therapy Given BNP of 3831 echocardiogram was obtained that showed EF 70 to 75% with grade 2 diastolic dysfunction Patient received IV Lasix ,  Plan: Hold Lasix  per nephrology recommendations Monitor ins and outs Daily renal function  AKI on CKD stage IIIb  Patient with baseline creatinine of 2 however creatinine on presentation of 4.7 Nephrologist have been consulted Creatinine worsening Plan: Lasix  on hold Monitor renal function closely   Lymphedema of the left lower extremity Doppler ultrasound did not show a clot Suspect chronic   Essential hypertension:  Holding nephrotoxic agents Continue amlodipine  Monitor blood pressure closely   Microcytic anemia Iron indices indicative of anemia of chronic disease May benefit from iron supplementation however in the setting of ileus will defer at this time   Hypokalemia:  Continue repletion and monitoring   Vitamin D  deficiency:  Continue vitamin D  supplementation   OSA:  Continue CPAP at night   DVT prophylaxis: SQH Code Status: Full Family Communication: None Disposition Plan: Status is: Inpatient Remains inpatient appropriate because: AKI on CKD   Level of care: Progressive  Consultants:  Nephrology  Procedures:  None  Antimicrobials: None   Subjective: Seen and examined.  NGT in place with good effect.  Patient endorses thirst and hunger  Objective: Vitals:   03/10/24 0404 03/10/24 0500 03/10/24 0823 03/10/24 1200  BP: (!) 156/90  (!) 159/95 (!) 151/79  Pulse: 79  81 80  Resp: 17     Temp: 98.6 F (37 C)  97.8 F (36.6 C) 97.8 F (36.6 C)  TempSrc:      SpO2: 95%  100% 96%  Weight:  ROLLEN)  174.6 kg    Height:        Intake/Output Summary (Last 24 hours) at 03/10/2024 1312 Last data filed at 03/10/2024 0500 Gross per 24 hour  Intake --  Output 1050 ml  Net -1050 ml   Filed Weights   03/07/24 0503 03/08/24 0500 03/10/24  0500  Weight: (!) 174.6 kg (!) 174.6 kg (!) 174.6 kg    Examination:  General exam: Appears short of breath Respiratory system: Diminished breath sounds.  Normal work of breathing.  3 L Cardiovascular system: S1 S2, RRR, no murmurs, no pedal edema Gastrointestinal system: Obese, tense, decreased bowel sounds, nontender to palpation Central nervous system: Alert and oriented. No focal neurological deficits. Extremities: Symmetric 5 x 5 power. Skin: No rashes, lesions or ulcers Psychiatry: Judgement and insight appear normal. Mood & affect appropriate.     Data Reviewed: I have personally reviewed following labs and imaging studies  CBC: Recent Labs  Lab 03/06/24 0952 03/07/24 0415 03/08/24 0338 03/09/24 0356  WBC 19.2* 11.5* 11.9* 14.3*  NEUTROABS  --   --  8.6* 11.6*  HGB 9.4* 9.1* 8.9* 9.2*  HCT 30.5* 29.2* 30.0* 30.9*  MCV 74.9* 74.9* 76.1* 76.1*  PLT 189 154 159 176   Basic Metabolic Panel: Recent Labs  Lab 03/06/24 1533 03/06/24 1752 03/07/24 0415 03/07/24 1753 03/08/24 0338 03/09/24 0356 03/10/24 0442  NA  --  139 140  --  138 141 144  K  --  3.1* 3.2*  --  3.2* 3.2* 3.6  CL  --  101 102  --  101 103 104  CO2  --  24 24  --  24 23 23   GLUCOSE  --  115* 109*  --  112* 110* 103*  BUN  --  43* 46*  --  44* 44* 56*  CREATININE  --  4.73* 4.77*  --  3.94* 3.80* 4.68*  CALCIUM   --  8.7* 8.5*  --  8.6* 9.4 9.9  MG 1.5*  --   --  2.1  --   --   --   PHOS  --   --   --  3.7  --   --   --    GFR: Estimated Creatinine Clearance: 36.3 mL/min (A) (by C-G formula based on SCr of 4.68 mg/dL (H)). Liver Function Tests: Recent Labs  Lab 03/06/24 9047 03/07/24 1753  AST 60* 38  ALT 25 23  ALKPHOS 78 72  BILITOT 0.7 0.4  PROT 8.1 7.6  ALBUMIN 3.5 3.2*   No results for input(s): LIPASE, AMYLASE in the last 168 hours. No results for input(s): AMMONIA in the last 168 hours. Coagulation Profile: No results for input(s): INR, PROTIME in the last 168  hours. Cardiac Enzymes: No results for input(s): CKTOTAL, CKMB, CKMBINDEX, TROPONINI in the last 168 hours. BNP (last 3 results) Recent Labs    03/06/24 0952  PROBNP 3,831.0*   HbA1C: No results for input(s): HGBA1C in the last 72 hours.  CBG: Recent Labs  Lab 03/09/24 1238 03/09/24 1644 03/09/24 2151 03/10/24 0824 03/10/24 1159  GLUCAP 111* 115* 109* 106* 114*   Lipid Profile: No results for input(s): CHOL, HDL, LDLCALC, TRIG, CHOLHDL, LDLDIRECT in the last 72 hours. Thyroid  Function Tests: No results for input(s): TSH, T4TOTAL, FREET4, T3FREE, THYROIDAB in the last 72 hours.  Anemia Panel: Recent Labs    03/07/24 1753  VITAMINB12 639  FOLATE 9.5  FERRITIN 553*  TIBC 195*  IRON 14*   Sepsis Labs: Recent  Labs  Lab 03/06/24 9047  LATICACIDVEN 1.6    Recent Results (from the past 240 hours)  Resp panel by RT-PCR (RSV, Flu A&B, Covid) Anterior Nasal Swab     Status: None   Collection Time: 03/06/24  9:26 AM   Specimen: Anterior Nasal Swab  Result Value Ref Range Status   SARS Coronavirus 2 by RT PCR NEGATIVE NEGATIVE Final    Comment: (NOTE) SARS-CoV-2 target nucleic acids are NOT DETECTED.  The SARS-CoV-2 RNA is generally detectable in upper respiratory specimens during the acute phase of infection. The lowest concentration of SARS-CoV-2 viral copies this assay can detect is 138 copies/mL. A negative result does not preclude SARS-Cov-2 infection and should not be used as the sole basis for treatment or other patient management decisions. A negative result may occur with  improper specimen collection/handling, submission of specimen other than nasopharyngeal swab, presence of viral mutation(s) within the areas targeted by this assay, and inadequate number of viral copies(<138 copies/mL). A negative result must be combined with clinical observations, patient history, and epidemiological information. The expected result is  Negative.  Fact Sheet for Patients:  bloggercourse.com  Fact Sheet for Healthcare Providers:  seriousbroker.it  This test is no t yet approved or cleared by the [place]  FDA and  has been authorized for detection and/or diagnosis of SARS-CoV-2 by FDA under an Emergency Use Authorization (EUA). This EUA will remain  in effect (meaning this test can be used) for the duration of the COVID-19 declaration under Section 564(b)(1) of the Act, 21 U.S.C.section 360bbb-3(b)(1), unless the authorization is terminated  or revoked sooner.       Influenza A by PCR NEGATIVE NEGATIVE Final   Influenza B by PCR NEGATIVE NEGATIVE Final    Comment: (NOTE) The Xpert Xpress SARS-CoV-2/FLU/RSV plus assay is intended as an aid in the diagnosis of influenza from Nasopharyngeal swab specimens and should not be used as a sole basis for treatment. Nasal washings and aspirates are unacceptable for Xpert Xpress SARS-CoV-2/FLU/RSV testing.  Fact Sheet for Patients: bloggercourse.com  Fact Sheet for Healthcare Providers: seriousbroker.it  This test is not yet approved or cleared by the [place]  FDA and has been authorized for detection and/or diagnosis of SARS-CoV-2 by FDA under an Emergency Use Authorization (EUA). This EUA will remain in effect (meaning this test can be used) for the duration of the COVID-19 declaration under Section 564(b)(1) of the Act, 21 U.S.C. section 360bbb-3(b)(1), unless the authorization is terminated or revoked.     Resp Syncytial Virus by PCR NEGATIVE NEGATIVE Final    Comment: (NOTE) Fact Sheet for Patients: bloggercourse.com  Fact Sheet for Healthcare Providers: seriousbroker.it  This test is not yet approved or cleared by the [place]  FDA and has been authorized for detection and/or diagnosis of  SARS-CoV-2 by FDA under an Emergency Use Authorization (EUA). This EUA will remain in effect (meaning this test can be used) for the duration of the COVID-19 declaration under Section 564(b)(1) of the Act, 21 U.S.C. section 360bbb-3(b)(1), unless the authorization is terminated or revoked.  Performed at [facility], [address] [place], [place] [address]   Blood culture (routine x 2)     Status: None (Preliminary result)   Collection Time: 03/06/24  9:52 AM   Specimen: BLOOD  Result Value Ref Range Status   Specimen Description BLOOD BLOOD RIGHT ARM  Final   Special Requests   Final    BOTTLES DRAWN AEROBIC AND ANAEROBIC Blood Culture results may not  be optimal due to an inadequate volume of blood received in culture bottles   Culture   Final    NO GROWTH 4 DAYS Performed at [facility], [address] [place], [place] [address]    Report Status PENDING  Incomplete  Blood culture (routine x 2)     Status: None (Preliminary result)   Collection Time: 03/06/24  9:52 AM   Specimen: BLOOD  Result Value Ref Range Status   Specimen Description BLOOD BLOOD LEFT HAND  Final   Special Requests   Final    BOTTLES DRAWN AEROBIC AND ANAEROBIC Blood Culture adequate volume   Culture   Final    NO GROWTH 4 DAYS Performed at [facility], [address] [place], [place] [address]    Report Status PENDING  Incomplete         Radiology Studies: DG Abd 1 View Result Date: 03/10/2024 EXAM: 1 VIEW XRAY OF THE ABDOMEN 03/10/2024 07:13:00 AM COMPARISON: 03/09/2024 CLINICAL HISTORY: Ileus (HCC) 01250 FINDINGS: LINES, TUBES AND DEVICES: Enteric tube in place with tip and side port projecting over the gastric body. BOWEL: Gaseous distension of loops of small bowel and colon. SOFT TISSUES: No abnormal calcifications. BONES: No acute fracture. IMPRESSION: 1. Enteric tube in place with marked gaseous distension of small bowel and colon. Electronically signed by:  Michaeline Blanch MD 03/10/2024 11:18 AM EST RP Workstation: [id number]   DG Abd 1 View Result Date: 03/09/2024 EXAM: 1 VIEW XRAY OF THE ABDOMEN 03/09/2024 09:49:09 PM COMPARISON: None available. CLINICAL HISTORY: Encounter for nasogastric (NG) tube placement FINDINGS: LINES, TUBES AND DEVICES: Enteric tube in place with distal tip and side port terminating within the expected location of the stomach. BOWEL: Dilated loops of small bowel in visualized upper abdomen. SOFT TISSUES: No abnormal calcifications. BONES: No acute fracture. IMPRESSION: 1. Dilated loops of small bowel in the visualized upper abdomen. 2. Enteric tube in place with distal tip and side port terminating within the expected location of the stomach. Electronically signed by: Greig Pique MD 03/09/2024 10:20 PM EST RP Workstation: [id number]   DG Chest 1 View Result Date: 03/09/2024 EXAM: 1 VIEW(S) XRAY OF THE CHEST 03/09/2024 09:49:09 PM COMPARISON: 03/06/2024 CLINICAL HISTORY: Encounter for nasogastric (NG) tube placement FINDINGS: LINES, TUBES AND DEVICES: The nasogastric tube tip overlies the upper midline mediastinum. LUNGS AND PLEURA: Low lung volume. Mild pulmonary edema, possibly accentuated by low lung volume. No focal pulmonary opacity. No pleural effusion. No pneumothorax. HEART AND MEDIASTINUM: Stable moderate cardiomegaly. The nasogastric tube tip overlies the upper midline mediastinum. BONES AND SOFT TISSUES: No acute osseous abnormality. IMPRESSION: 1. Enteric tube tip projects over the upper midline mediastinum; recommend advancement and repeat radiograph to confirm gastric positioning. 2. Mild pulmonary edema, possibly accentuated by low lung volume. 3. Stable moderate cardiomegaly. Electronically signed by: Greig Pique MD 03/09/2024 10:19 PM EST RP Workstation: [id number]   CT ABDOMEN PELVIS WO CONTRAST Result Date: 03/09/2024 EXAM: CT ABDOMEN AND PELVIS WITHOUT CONTRAST 03/09/2024 01:33:31 PM TECHNIQUE: CT of the abdomen  and pelvis was performed without the administration of intravenous contrast. Multiplanar reformatted images are provided for review. Automated exposure control, iterative reconstruction, and/or weight-based adjustment of the mA/kV was utilized to reduce the radiation dose to as low as reasonably achievable. COMPARISON: None available. CLINICAL HISTORY: Abdominal pain, acute, nonlocalized. FINDINGS: LOWER CHEST: Bibasilar atelectasis versus scarring. LIVER: The liver is unremarkable. GALLBLADDER AND BILE DUCTS: Status post cholecystectomy. No biliary ductal dilatation. SPLEEN: No acute abnormality. PANCREAS: No acute  abnormality. ADRENAL GLANDS: No acute abnormality. KIDNEYS, URETERS AND BLADDER: No stones in the kidneys or ureters. No hydronephrosis. No perinephric or periureteral stranding. Urinary bladder is unremarkable. GI AND BOWEL: The gastric lumen is distended. The mid to distal small bowel are dilated with gas and fluid. The small bowel measures up to 4 cm. Air-fluid levels are noted. No pneumatosis. No bowel thickening. The ascending colon and transverse colon are distended but not dilated with stool and gas. The descending colon and rectosigmoid colon are decompressed. No transition point. There is no bowel obstruction. PERITONEUM AND RETROPERITONEUM: No ascites. No free air. VASCULATURE: Aorta is normal in caliber. LYMPH NODES: No lymphadenopathy. REPRODUCTIVE ORGANS: Prostate unremarkable. BONES AND SOFT TISSUES: No acute osseous abnormality. Small to moderate volume umbilical fat-containing hernia. IMPRESSION: 1. Distended gastric lumen and dilated mid to distal small bowel with air-fluid levels, without pneumatosis or bowel wall thickening; no transition point identified, most consistent with ileus. Limited evaluation on this noncontrast study. 2. Small to moderate fat-containing umbilical hernia. Electronically signed by: Morgane Naveau MD 03/09/2024 02:16 PM EST RP Workstation: [id number]         Scheduled Meds:  amLODipine   5 mg Oral Daily   azithromycin   500 mg Oral Daily   famotidine   20 mg Oral BID   gabapentin   100 mg Oral TID   heparin   5,000 Units Subcutaneous Q8H   simethicone   80 mg Oral QID   sodium chloride  flush  3 mL Intravenous Q12H   Continuous Infusions:  acetaminophen  1,000 mg (03/10/24 0900)   cefTRIAXone  (ROCEPHIN )  IV 2 g (03/09/24 2341)   promethazine  (PHENERGAN ) injection (IM or IVPB) 12.5 mg (03/09/24 1419)     LOS: 4 days    Calvin KATHEE Robson, MD [facility]   If 7PM-7AM, please contact night-coverage  03/10/2024, 1:12 PM   "

## 2024-03-10 NOTE — Progress Notes (Signed)
 Physical Therapy Treatment Patient Details Name: Darrell Campbell MRN: [id number] DOB: [birth year] Today's Date: 03/10/2024   History of Present Illness Darrell Campbell is a 33yoM who comes to [facility] on 12/25 with 3 days URI symptoms, LLE edema. PMH: HTN, OSA, hypoferritinemia.  BNP: 3831, pt febrile, RVP negative.    PT Comments  Pt seen during OT eval for improved outcomes due to level of assist.  He needs mod encouragement but does participate and put good effort in once moving.  He struggles to get to EOB but does need mod a x 2 pulling heavily on our hands to get upright.  Once sitting he is steady and does self initiate attempts at standing.  He initially holds our hands but is given a walker for support I have never used one before  He is able to stand from lower bed height and once up is steady with no knee buckling or LOB's.  He stands for several minutes and is able to take a few side shuffles up towards [facility] but is limited by general fatigue and some SOB but sats and HR WFL.   Initial recommendations at eval were for SNF.   Pt is progressing and voices desire to return home vs SNF if able.  Encouragement and general motivation education for pt.  Will plan to see pt again tomorrow for OOB and mobility as tolerated and anticipate updating recommendations if he does well.  Will add in mobility team for increased mobility opportunities.   If plan is discharge home, recommend the following: Two people to help with walking and/or transfers;Help with stairs or ramp for entrance;A lot of help with bathing/dressing/bathroom   Can travel by private vehicle        Equipment Recommendations  None recommended by PT    Recommendations for Other Services       Precautions / Restrictions Precautions Precautions: Fall Recall of Precautions/Restrictions: Intact Restrictions Weight Bearing Restrictions Per Provider Order: No     Mobility  Bed Mobility Overal bed mobility: Needs  Assistance Bed Mobility: Supine to Sit, Sit to Supine     Supine to sit: +2 for physical assistance, Mod assist       Patient Response: Cooperative  Transfers Overall transfer level: Needs assistance Equipment used: Rolling walker (2 wheels) Transfers: Sit to/from Stand Sit to Stand: Min assist, +2 safety/equipment                Ambulation/Gait Ambulation/Gait assistance: Contact guard assist, +2 safety/equipment Gait Distance (Feet): 3 Feet Assistive device: Rolling walker (2 wheels) Gait Pattern/deviations: Step-to pattern Gait velocity: dec     General Gait Details: side steps along bed to reposition with no knee buckling and good balance.   Stairs             Wheelchair Mobility     Tilt Bed Tilt Bed Patient Response: Cooperative  Modified Rankin (Stroke Patients Only)       Balance Overall balance assessment: Needs assistance Sitting-balance support: Feet supported Sitting balance-Leahy Scale: Good     Standing balance support: Reliant on assistive device for balance, During functional activity, Bilateral upper extremity supported Standing balance-Leahy Scale: Fair                              Musician Communication: No apparent difficulties  Cognition Arousal: Alert Behavior During Therapy: WFL for tasks assessed/performed  Following commands: Intact      Cueing Cueing Techniques: Verbal cues  Exercises      General Comments        Pertinent Vitals/Pain Pain Assessment Pain Assessment: Faces Faces Pain Scale: Hurts even more Pain Location: Left foot pain Pain Descriptors / Indicators: Burning, Discomfort, Moaning, Tender Pain Intervention(s): Limited activity within patient's tolerance, Monitored during session, Repositioned    Home Living Family/patient expects to be discharged to:: Private residence Living Arrangements: Parent Available Help at  Discharge: Family;Available PRN/intermittently Type of Home: House Home Access: Level entry     Alternate Level Stairs-Number of Steps: 13 Home Layout: Two level;1/2 bath on main level;Bed/bath upstairs;Able to live on main level with bedroom/bathroom Home Equipment: None      Prior Function            PT Goals (current goals can now be found in the care plan section) Progress towards PT goals: Progressing toward goals    Frequency    Min 2X/week      PT Plan      Co-evaluation              AM-PAC PT 6 Clicks Mobility   Outcome Measure  Help needed turning from your back to your side while in a flat bed without using bedrails?: A Lot Help needed moving from lying on your back to sitting on the side of a flat bed without using bedrails?: A Lot Help needed moving to and from a bed to a chair (including a wheelchair)?: A Little Help needed standing up from a chair using your arms (e.g., wheelchair or bedside chair)?: A Little Help needed to walk in hospital room?: A Little Help needed climbing 3-5 steps with a railing? : A Lot 6 Click Score: 15    End of Session Equipment Utilized During Treatment: Oxygen Activity Tolerance: Patient tolerated treatment well Patient left: in bed;with call bell/phone within reach Nurse Communication: Mobility status PT Visit Diagnosis: Unsteadiness on feet (R26.81);Other abnormalities of gait and mobility (R26.89);Muscle weakness (generalized) (M62.81)     Time: 8889-8862 PT Time Calculation (min) (ACUTE ONLY): 27 min  Charges:    $Therapeutic Activity: 23-37 mins PT General Charges $$ ACUTE PT VISIT: 1 Visit                   Lauraine Gills, PTA 03/10/2024, 2:35 PM

## 2024-03-11 ENCOUNTER — Inpatient Hospital Stay: Payer: Self-pay

## 2024-03-11 LAB — PROTEIN / CREATININE RATIO, URINE
Creatinine, Urine: 155 mg/dL
Protein Creatinine Ratio: 0.4 mg/mg — ABNORMAL HIGH
Total Protein, Urine: 62 mg/dL

## 2024-03-11 LAB — CBC WITH DIFFERENTIAL/PLATELET
Abs Immature Granulocytes: 0.36 K/uL — ABNORMAL HIGH (ref 0.00–0.07)
Basophils Absolute: 0 K/uL (ref 0.0–0.1)
Basophils Relative: 0 %
Eosinophils Absolute: 0.5 K/uL (ref 0.0–0.5)
Eosinophils Relative: 3 %
HCT: 28.8 % — ABNORMAL LOW (ref 39.0–52.0)
Hemoglobin: 8.7 g/dL — ABNORMAL LOW (ref 13.0–17.0)
Immature Granulocytes: 2 %
Lymphocytes Relative: 9 %
Lymphs Abs: 1.3 K/uL (ref 0.7–4.0)
MCH: 22.7 pg — ABNORMAL LOW (ref 26.0–34.0)
MCHC: 30.2 g/dL (ref 30.0–36.0)
MCV: 75.2 fL — ABNORMAL LOW (ref 80.0–100.0)
Monocytes Absolute: 1.1 K/uL — ABNORMAL HIGH (ref 0.1–1.0)
Monocytes Relative: 8 %
Neutro Abs: 11.6 K/uL — ABNORMAL HIGH (ref 1.7–7.7)
Neutrophils Relative %: 78 %
Platelets: 333 K/uL (ref 150–400)
RBC: 3.83 MIL/uL — ABNORMAL LOW (ref 4.22–5.81)
RDW: 17.9 % — ABNORMAL HIGH (ref 11.5–15.5)
WBC: 14.8 K/uL — ABNORMAL HIGH (ref 4.0–10.5)
nRBC: 0 % (ref 0.0–0.2)

## 2024-03-11 LAB — URINALYSIS, COMPLETE (UACMP) WITH MICROSCOPIC
Bilirubin Urine: NEGATIVE
Glucose, UA: NEGATIVE mg/dL
Ketones, ur: NEGATIVE mg/dL
Leukocytes,Ua: NEGATIVE
Nitrite: NEGATIVE
Protein, ur: 30 mg/dL — AB
Specific Gravity, Urine: 1.015 (ref 1.005–1.030)
pH: 5 (ref 5.0–8.0)

## 2024-03-11 LAB — PROTEIN ELECTROPHORESIS, SERUM
A/G Ratio: 0.6 — ABNORMAL LOW (ref 0.7–1.7)
Albumin ELP: 2.6 g/dL — ABNORMAL LOW (ref 2.9–4.4)
Alpha-1-Globulin: 0.5 g/dL — ABNORMAL HIGH (ref 0.0–0.4)
Alpha-2-Globulin: 1.1 g/dL — ABNORMAL HIGH (ref 0.4–1.0)
Beta Globulin: 1.2 g/dL (ref 0.7–1.3)
Gamma Globulin: 1.7 g/dL (ref 0.4–1.8)
Globulin, Total: 4.4 g/dL — ABNORMAL HIGH (ref 2.2–3.9)
Total Protein ELP: 7 g/dL (ref 6.0–8.5)

## 2024-03-11 LAB — CULTURE, BLOOD (ROUTINE X 2)
Culture: NO GROWTH
Culture: NO GROWTH
Special Requests: ADEQUATE

## 2024-03-11 LAB — GLUCOSE, CAPILLARY
Glucose-Capillary: 98 mg/dL (ref 70–99)
Glucose-Capillary: 114 mg/dL — ABNORMAL HIGH (ref 70–99)
Glucose-Capillary: 77 mg/dL (ref 70–99)
Glucose-Capillary: 86 mg/dL (ref 70–99)

## 2024-03-11 LAB — BASIC METABOLIC PANEL WITH GFR
Anion gap: 15 (ref 5–15)
BUN: 64 mg/dL — ABNORMAL HIGH (ref 6–20)
CO2: 23 mmol/L (ref 22–32)
Calcium: 9.4 mg/dL (ref 8.9–10.3)
Chloride: 99 mmol/L (ref 98–111)
Creatinine, Ser: 5.43 mg/dL — ABNORMAL HIGH (ref 0.61–1.24)
GFR, Estimated: 13 mL/min — ABNORMAL LOW
Glucose, Bld: 117 mg/dL — ABNORMAL HIGH (ref 70–99)
Potassium: 3.5 mmol/L (ref 3.5–5.1)
Sodium: 138 mmol/L (ref 135–145)

## 2024-03-11 LAB — HEPATITIS B CORE ANTIBODY, TOTAL: HEP B CORE AB: NEGATIVE

## 2024-03-11 MED ORDER — AMLODIPINE BESYLATE 10 MG PO TABS
10.0000 mg | ORAL_TABLET | Freq: Every day | ORAL | Status: DC
  Administered 2024-03-12 – 2024-03-16 (×5): 10 mg via ORAL
  Filled 2024-03-11 (×5): qty 1

## 2024-03-11 MED ORDER — ACETAMINOPHEN 10 MG/ML IV SOLN
1000.0000 mg | Freq: Once | INTRAVENOUS | Status: AC
  Administered 2024-03-11: 1000 mg via INTRAVENOUS
  Filled 2024-03-11: qty 100

## 2024-03-11 MED ORDER — AMLODIPINE BESYLATE 5 MG PO TABS
5.0000 mg | ORAL_TABLET | Freq: Once | ORAL | Status: AC
  Administered 2024-03-11: 5 mg via ORAL
  Filled 2024-03-11: qty 1

## 2024-03-11 MED ORDER — LIDOCAINE VISCOUS HCL 2 % MT SOLN
15.0000 mL | Freq: Four times a day (QID) | OROMUCOSAL | Status: DC | PRN
  Administered 2024-03-11: 15 mL via OROMUCOSAL
  Filled 2024-03-11 (×2): qty 15

## 2024-03-11 NOTE — Progress Notes (Signed)
 " PROGRESS NOTE    Darrell Campbell  FMW:[id number] DOB: Aug 13, 1990 DOA: 03/06/2024 PCP: Osker Tinnie HERO, FNP    Brief Narrative:   33 y.o. year old male with medical history of hypertension, class III obesity, OSA, iron deficiency anemia presenting to the ED with worsening shortness of breath after having URI symptoms. He states symptoms started Tuesday morning. Then he developed worsening shortness of breath. He had mylagias. On arrival to the ED patient was noted to be HDS stable. CBC with significant leukocytosis at 19.2, hemoglobin at baseline consistent with microcytic anemia, CMP with significant hypokalemia at 3.1, significant AKI, proBNP elevated at 3831, troponin elevated at 134 with repeat pending. Lactic acid normal. D-dimer elevated. Blood cultures obtained. Chest x-ray without any acute findings but did show cardiomegaly and slight volume overload. CT chest without contrast ordered and pending. EDP gave patient 1 dose of IV Lasix  80 mg. Given patient presentation, TR[facility] contacted for admission.   Assessment & Plan:   Principal Problem:   Acute hypoxic respiratory failure (HCC) Active Problems:   Essential hypertension   Morbid obesity (HCC)   Iron deficiency anemia   Lymphedema   Prediabetes   OSA (obstructive sleep apnea)   Vitamin D  deficiency   AKI (acute kidney injury)   Hypokalemia  Acute abdominal pain Intractable nausea and vomiting Ileus Patient endorsed abdominal pain on 12/27.  Progressed on 12/28 associated with nausea and vomiting.  CT abdomen pelvis demonstrates gaseous distention consistent with ileus.  No transition point to suggest obstruction.  Patient had projectile vomiting on 12/20 8 PM.  NGT placed for gastric decompression and patient feels better. 12/30 still with significant abdominal distention.  KUB pending read Plan: Continue NGT set to continuous low suction Continue clear liquid diet Antiemetics Follow-up repeat KUB No  narcotics Mobilize Consider general surgery consultation if not progressing  Acute hypoxic respiratory failure likely multifactorial in setting of pneumonia.  Acute heart failure with preserved ejection fraction Continue current antibiotic therapy Given BNP of 3831 echocardiogram was obtained that showed EF 70 to 75% with grade 2 diastolic dysfunction Patient received IV Lasix   Plan: Hold Lasix  per nephrology recommendations Monitor ins and outs Daily renal function  AKI on CKD stage IIIb  Patient with baseline creatinine of 2 however creatinine on presentation of 4.7 Nephrologist have been consulted Creatinine worsening Plan: Lasix  on hold Monitor renal function closely May require renal replacement therapy during this admission   Lymphedema of the left lower extremity Doppler ultrasound did not show a clot Suspect chronic   Essential hypertension:  Holding nephrotoxic agents Continue amlodipine  Monitor blood pressure closely   Microcytic anemia Iron indices indicative of anemia of chronic disease May benefit from iron supplementation however in the setting of ileus will defer at this time   Hypokalemia:  Continue repletion and monitoring   Vitamin D  deficiency:  Continue vitamin D  supplementation   OSA:  Continue CPAP at night   DVT prophylaxis: SQH Code Status: Full Family Communication: None Disposition Plan: Status is: Inpatient Remains inpatient appropriate because: AKI on CKD, ileus   Level of care: Progressive  Consultants:  Nephrology  Procedures:  None  Antimicrobials: None   Subjective: Seen and examined.  Appears significantly winded.  NGT in place.  Set to intermittent suction.  Objective: Vitals:   03/11/24 0546 03/11/24 0818 03/11/24 1231 03/11/24 1234  BP: (!) 168/98 132/81 132/82 132/82  Pulse: 80 84  87  Resp:  20    Temp:  98  F (36.7 C)  98.8 F (37.1 C)  TempSrc:  Axillary    SpO2:  96%  100%  Weight:      Height:         Intake/Output Summary (Last 24 hours) at 03/11/2024 1519 Last data filed at 03/11/2024 0630 Gross per 24 hour  Intake 243 ml  Output 555 ml  Net -312 ml   Filed Weights   03/08/24 0500 03/10/24 0500 03/11/24 0500  Weight: (!) 174.6 kg (!) 174.6 kg (!) 174.2 kg    Examination:  General exam: Appears short of breath Respiratory system: Diminished breath sounds.  Normal work of breathing.  3 L Cardiovascular system: S1 S2, RRR, no murmurs, no pedal edema Gastrointestinal system: Obese, tense, decreased bowel sounds, nontender to palpation Central nervous system: Alert and oriented. No focal neurological deficits. Extremities: Symmetric 5 x 5 power. Skin: No rashes, lesions or ulcers Psychiatry: Judgement and insight appear normal. Mood & affect appropriate.     Data Reviewed: I have personally reviewed following labs and imaging studies  CBC: Recent Labs  Lab 03/06/24 0952 03/07/24 0415 03/08/24 0338 03/09/24 0356 03/11/24 1000  WBC 19.2* 11.5* 11.9* 14.3* 14.8*  NEUTROABS  --   --  8.6* 11.6* 11.6*  HGB 9.4* 9.1* 8.9* 9.2* 8.7*  HCT 30.5* 29.2* 30.0* 30.9* 28.8*  MCV 74.9* 74.9* 76.1* 76.1* 75.2*  PLT 189 154 159 176 333   Basic Metabolic Panel: Recent Labs  Lab 03/06/24 1533 03/06/24 1752 03/07/24 0415 03/07/24 1753 03/08/24 0338 03/09/24 0356 03/10/24 0442 03/11/24 1000  NA  --    < > 140  --  138 141 144 138  K  --    < > 3.2*  --  3.2* 3.2* 3.6 3.5  CL  --    < > 102  --  101 103 104 99  CO2  --    < > 24  --  24 23 23 23   GLUCOSE  --    < > 109*  --  112* 110* 103* 117*  BUN  --    < > 46*  --  44* 44* 56* 64*  CREATININE  --    < > 4.77*  --  3.94* 3.80* 4.68* 5.43*  CALCIUM   --    < > 8.5*  --  8.6* 9.4 9.9 9.4  MG 1.5*  --   --  2.1  --   --   --   --   PHOS  --   --   --  3.7  --   --   --   --    < > = values in this interval not displayed.   GFR: Estimated Creatinine Clearance: 31.2 mL/min (A) (by C-G formula based on SCr of 5.43 mg/dL  (H)). Liver Function Tests: Recent Labs  Lab 03/06/24 9047 03/07/24 1753  AST 60* 38  ALT 25 23  ALKPHOS 78 72  BILITOT 0.7 0.4  PROT 8.1 7.6  ALBUMIN 3.5 3.2*   No results for input(s): LIPASE, AMYLASE in the last 168 hours. No results for input(s): AMMONIA in the last 168 hours. Coagulation Profile: No results for input(s): INR, PROTIME in the last 168 hours. Cardiac Enzymes: No results for input(s): CKTOTAL, CKMB, CKMBINDEX, TROPONINI in the last 168 hours. BNP (last 3 results) Recent Labs    03/06/24 0952  PROBNP 3,831.0*   HbA1C: No results for input(s): HGBA1C in the last 72 hours.  CBG: Recent Labs  Lab 03/10/24  1159 03/10/24 1648 03/10/24 2204 03/11/24 0819 03/11/24 1234  GLUCAP 114* 120* 120* 98 114*   Lipid Profile: No results for input(s): CHOL, HDL, LDLCALC, TRIG, CHOLHDL, LDLDIRECT in the last 72 hours. Thyroid  Function Tests: No results for input(s): TSH, T4TOTAL, FREET4, T3FREE, THYROIDAB in the last 72 hours.  Anemia Panel: No results for input(s): VITAMINB12, FOLATE, FERRITIN, TIBC, IRON, RETICCTPCT in the last 72 hours.  Sepsis Labs: Recent Labs  Lab 03/06/24 9047  LATICACIDVEN 1.6    Recent Results (from the past 240 hours)  Resp panel by RT-PCR (RSV, Flu A&B, Covid) Anterior Nasal Swab     Status: None   Collection Time: 03/06/24  9:26 AM   Specimen: Anterior Nasal Swab  Result Value Ref Range Status   SARS Coronavirus 2 by RT PCR NEGATIVE NEGATIVE Final    Comment: (NOTE) SARS-CoV-2 target nucleic acids are NOT DETECTED.  The SARS-CoV-2 RNA is generally detectable in upper respiratory specimens during the acute phase of infection. The lowest concentration of SARS-CoV-2 viral copies this assay can detect is 138 copies/mL. A negative result does not preclude SARS-Cov-2 infection and should not be used as the sole basis for treatment or other patient management decisions. A  negative result may occur with  improper specimen collection/handling, submission of specimen other than nasopharyngeal swab, presence of viral mutation(s) within the areas targeted by this assay, and inadequate number of viral copies(<138 copies/mL). A negative result must be combined with clinical observations, patient history, and epidemiological information. The expected result is Negative.  Fact Sheet for Patients:  bloggercourse.com  Fact Sheet for Healthcare Providers:  seriousbroker.it  This test is no t yet approved or cleared by the [place]  FDA and  has been authorized for detection and/or diagnosis of SARS-CoV-2 by FDA under an Emergency Use Authorization (EUA). This EUA will remain  in effect (meaning this test can be used) for the duration of the COVID-19 declaration under Section 564(b)(1) of the Act, 21 U.S.C.section 360bbb-3(b)(1), unless the authorization is terminated  or revoked sooner.       Influenza A by PCR NEGATIVE NEGATIVE Final   Influenza B by PCR NEGATIVE NEGATIVE Final    Comment: (NOTE) The Xpert Xpress SARS-CoV-2/FLU/RSV plus assay is intended as an aid in the diagnosis of influenza from Nasopharyngeal swab specimens and should not be used as a sole basis for treatment. Nasal washings and aspirates are unacceptable for Xpert Xpress SARS-CoV-2/FLU/RSV testing.  Fact Sheet for Patients: bloggercourse.com  Fact Sheet for Healthcare Providers: seriousbroker.it  This test is not yet approved or cleared by the [place]  FDA and has been authorized for detection and/or diagnosis of SARS-CoV-2 by FDA under an Emergency Use Authorization (EUA). This EUA will remain in effect (meaning this test can be used) for the duration of the COVID-19 declaration under Section 564(b)(1) of the Act, 21 U.S.C. section 360bbb-3(b)(1), unless the authorization  is terminated or revoked.     Resp Syncytial Virus by PCR NEGATIVE NEGATIVE Final    Comment: (NOTE) Fact Sheet for Patients: bloggercourse.com  Fact Sheet for Healthcare Providers: seriousbroker.it  This test is not yet approved or cleared by the [place]  FDA and has been authorized for detection and/or diagnosis of SARS-CoV-2 by FDA under an Emergency Use Authorization (EUA). This EUA will remain in effect (meaning this test can be used) for the duration of the COVID-19 declaration under Section 564(b)(1) of the Act, 21 U.S.C. section 360bbb-3(b)(1), unless the authorization is terminated or revoked.  Performed  at [facility], [address] [place], [place] [address]   Blood culture (routine x 2)     Status: None   Collection Time: 03/06/24  9:52 AM   Specimen: BLOOD  Result Value Ref Range Status   Specimen Description BLOOD BLOOD RIGHT ARM  Final   Special Requests   Final    BOTTLES DRAWN AEROBIC AND ANAEROBIC Blood Culture results may not be optimal due to an inadequate volume of blood received in culture bottles   Culture   Final    NO GROWTH 5 DAYS Performed at [facility], [address], [place], [place] [address]    Report Status 03/11/2024 FINAL  Final  Blood culture (routine x 2)     Status: None   Collection Time: 03/06/24  9:52 AM   Specimen: BLOOD  Result Value Ref Range Status   Specimen Description BLOOD BLOOD LEFT HAND  Final   Special Requests   Final    BOTTLES DRAWN AEROBIC AND ANAEROBIC Blood Culture adequate volume   Culture   Final    NO GROWTH 5 DAYS Performed at [facility], [address] [place], [place] [address]    Report Status 03/11/2024 FINAL  Final         Radiology Studies: DG Abd 1 View Result Date: 03/10/2024 EXAM: 1 VIEW XRAY OF THE ABDOMEN 03/10/2024 07:13:00 AM COMPARISON: 03/09/2024 CLINICAL HISTORY: Ileus (HCC) [id number] FINDINGS:  LINES, TUBES AND DEVICES: Enteric tube in place with tip and side port projecting over the gastric body. BOWEL: Gaseous distension of loops of small bowel and colon. SOFT TISSUES: No abnormal calcifications. BONES: No acute fracture. IMPRESSION: 1. Enteric tube in place with marked gaseous distension of small bowel and colon. Electronically signed by: Michaeline Blanch MD 03/10/2024 11:18 AM EST RP Workstation: [id number]   DG Abd 1 View Result Date: 03/09/2024 EXAM: 1 VIEW XRAY OF THE ABDOMEN 03/09/2024 09:49:09 PM COMPARISON: None available. CLINICAL HISTORY: Encounter for nasogastric (NG) tube placement FINDINGS: LINES, TUBES AND DEVICES: Enteric tube in place with distal tip and side port terminating within the expected location of the stomach. BOWEL: Dilated loops of small bowel in visualized upper abdomen. SOFT TISSUES: No abnormal calcifications. BONES: No acute fracture. IMPRESSION: 1. Dilated loops of small bowel in the visualized upper abdomen. 2. Enteric tube in place with distal tip and side port terminating within the expected location of the stomach. Electronically signed by: Greig Pique MD 03/09/2024 10:20 PM EST RP Workstation: [id number]   DG Chest 1 View Result Date: 03/09/2024 EXAM: 1 VIEW(S) XRAY OF THE CHEST 03/09/2024 09:49:09 PM COMPARISON: 03/06/2024 CLINICAL HISTORY: Encounter for nasogastric (NG) tube placement FINDINGS: LINES, TUBES AND DEVICES: The nasogastric tube tip overlies the upper midline mediastinum. LUNGS AND PLEURA: Low lung volume. Mild pulmonary edema, possibly accentuated by low lung volume. No focal pulmonary opacity. No pleural effusion. No pneumothorax. HEART AND MEDIASTINUM: Stable moderate cardiomegaly. The nasogastric tube tip overlies the upper midline mediastinum. BONES AND SOFT TISSUES: No acute osseous abnormality. IMPRESSION: 1. Enteric tube tip projects over the upper midline mediastinum; recommend advancement and repeat radiograph to confirm gastric  positioning. 2. Mild pulmonary edema, possibly accentuated by low lung volume. 3. Stable moderate cardiomegaly. Electronically signed by: Greig Pique MD 03/09/2024 10:19 PM EST RP Workstation: [id number]        Scheduled Meds:  [START ON 03/12/2024] amLODipine   10 mg Oral Daily   famotidine   20 mg Oral BID   gabapentin   100 mg  Oral TID   heparin   5,000 Units Subcutaneous Q8H   simethicone   80 mg Oral QID   sodium chloride  flush  3 mL Intravenous Q12H   Continuous Infusions:  promethazine  (PHENERGAN ) injection (IM or IVPB) 12.5 mg (03/09/24 1419)     LOS: 5 days    Darrell KATHEE Robson, MD [facility]   If 7PM-7AM, please contact night-coverage  03/11/2024, 3:19 PM   "

## 2024-03-11 NOTE — Progress Notes (Signed)
 Physical Therapy Treatment Patient Details Name: Chritopher Coster MRN: [id number] DOB: 1990-09-24 Today's Date: 03/11/2024   History of Present Illness Ahmir Carriger is a 33yoM who comes to [facility] on 12/25 with 3 days URI symptoms, LLE edema. PMH: HTN, OSA, hypoferritinemia.  BNP: 3831, pt febrile, RVP negative.    PT Comments  Pt agrees with encouragement and education.  He takes inc time but is able to get to EOB with mod a x 1 and rails today.  Continues to require my hand - left - to pull up on to get upright.  Steady in sitting.   He stand to RW with assist to hold walker down and pulls up on it despite cues to push from bed.  Once up he transfers to [facility] with cga x 2.  Time given to expel gas and for moderate mucousy BM.  He needs assist for care before turning 180 degrees before sitting in recliner.  Opts to keep legs down.    Pt continues to need increased time to prep for movement but does well once up.  Remains generally limited by fatigue, pain and motivation.  Mobility team in to meet pt and to assist with treatment.  Will add in mobility visits to increase mobility in hopes of transition home vs rehab.     If plan is discharge home, recommend the following: Two people to help with walking and/or transfers;Help with stairs or ramp for entrance;A lot of help with bathing/dressing/bathroom   Can travel by private vehicle        Equipment Recommendations       Recommendations for Other Services       Precautions / Restrictions Precautions Precautions: Fall Recall of Precautions/Restrictions: Intact Restrictions Weight Bearing Restrictions Per Provider Order: No     Mobility  Bed Mobility Overal bed mobility: Needs Assistance Bed Mobility: Supine to Sit     Supine to sit: Mod assist, Used rails, HOB elevated     General bed mobility comments: yells out in pain with inc time for improved Patient Response: Cooperative  Transfers Overall transfer level: Needs  assistance Equipment used: Rolling walker (2 wheels) Transfers: Sit to/from Stand Sit to Stand: Contact guard assist, +2 safety/equipment                Ambulation/Gait Ambulation/Gait assistance: Contact guard assist, +2 safety/equipment Gait Distance (Feet): 6 Feet Assistive device: Rolling walker (2 wheels) Gait Pattern/deviations: Step-to pattern, Decreased step length - left, Decreased stance time - right, Wide base of support Gait velocity: dec     General Gait Details: good steps to [facility] then 180 degrees to recliner   Stairs             Wheelchair Mobility     Tilt Bed Tilt Bed Patient Response: Cooperative  Modified Rankin (Stroke Patients Only)       Balance Overall balance assessment: Needs assistance Sitting-balance support: Feet supported Sitting balance-Leahy Scale: Good     Standing balance support: Reliant on assistive device for balance, During functional activity, Bilateral upper extremity supported Standing balance-Leahy Scale: Fair                              Hotel Manager: No apparent difficulties  Cognition Arousal: Alert Behavior During Therapy: WFL for tasks assessed/performed   PT - Cognitive impairments: No apparent impairments  Following commands: Intact      Cueing Cueing Techniques: Verbal cues  Exercises      General Comments        Pertinent Vitals/Pain Pain Assessment Pain Assessment: Faces Faces Pain Scale: Hurts even more Pain Location: Left foot pain, abdomen Pain Descriptors / Indicators: Burning, Discomfort, Moaning, Tender Pain Intervention(s): Limited activity within patient's tolerance, Monitored during session, Repositioned    Home Living                          Prior Function            PT Goals (current goals can now be found in the care plan section) Progress towards PT goals: Progressing toward goals     Frequency    Min 2X/week      PT Plan      Co-evaluation              AM-PAC PT 6 Clicks Mobility   Outcome Measure  Help needed turning from your back to your side while in a flat bed without using bedrails?: A Lot Help needed moving from lying on your back to sitting on the side of a flat bed without using bedrails?: A Lot Help needed moving to and from a bed to a chair (including a wheelchair)?: A Little Help needed standing up from a chair using your arms (e.g., wheelchair or bedside chair)?: A Little Help needed to walk in hospital room?: A Little Help needed climbing 3-5 steps with a railing? : A Lot 6 Click Score: 15    End of Session Equipment Utilized During Treatment: Oxygen Activity Tolerance: Patient tolerated treatment well Patient left: in chair;with call bell/phone within reach Nurse Communication: Mobility status PT Visit Diagnosis: Unsteadiness on feet (R26.81);Other abnormalities of gait and mobility (R26.89);Muscle weakness (generalized) (M62.81)     Time: 8950-8872 PT Time Calculation (min) (ACUTE ONLY): 38 min  Charges:    $Therapeutic Activity: 38-52 mins PT General Charges $$ ACUTE PT VISIT: 1 Visit                   Lauraine Gills, PTA 03/11/2024, 11:37 AM

## 2024-03-11 NOTE — Progress Notes (Signed)
 " [facility]  ROUNDING NOTE   Subjective:   Patient laying in bed Alert and oriented Continues to have discomfort from NGT  Adequate urine noted in canister Creatinine 5.43 (4.68) (3.8)  Objective:  Vital signs in last 24 hours:  Temp:  [97.2 F (36.2 C)-98.8 F (37.1 C)] 98.8 F (37.1 C) (12/30 1547) Pulse Rate:  [66-87] 66 (12/30 1547) Resp:  [20-22] 20 (12/30 0818) BP: (117-184)/(65-99) 117/65 (12/30 1547) SpO2:  [96 %-100 %] 99 % (12/30 1547) Weight:  [174.2 kg] 174.2 kg (12/30 0500)  Weight change: -0.4 kg Filed Weights   03/08/24 0500 03/10/24 0500 03/11/24 0500  Weight: (!) 174.6 kg (!) 174.6 kg (!) 174.2 kg    Intake/Output: I/O last 3 completed shifts: In: 243 [P.O.:240; I.V.:3] Out: 1605 [Urine:550; Emesis/NG output:1055]   Intake/Output this shift:  No intake/output data recorded.  Physical Exam: General: Large body habitus  Head: Normocephalic, NGT  Eyes: Anicteric  Lungs:  Diminished  Heart: Regular rate   Abdomen:  Soft, nontender, obese  Extremities:  +++ peripheral edema, L>R  Neurologic: alert  Skin: No lesions  Access: None    Basic Metabolic Panel: Recent Labs  Lab 03/06/24 1533 03/06/24 1752 03/07/24 0415 03/07/24 1753 03/08/24 0338 03/09/24 0356 03/10/24 0442 03/11/24 1000  NA  --    < > 140  --  138 141 144 138  K  --    < > 3.2*  --  3.2* 3.2* 3.6 3.5  CL  --    < > 102  --  101 103 104 99  CO2  --    < > 24  --  24 23 23 23   GLUCOSE  --    < > 109*  --  112* 110* 103* 117*  BUN  --    < > 46*  --  44* 44* 56* 64*  CREATININE  --    < > 4.77*  --  3.94* 3.80* 4.68* 5.43*  CALCIUM   --    < > 8.5*  --  8.6* 9.4 9.9 9.4  MG 1.5*  --   --  2.1  --   --   --   --   PHOS  --   --   --  3.7  --   --   --   --    < > = values in this interval not displayed.    Liver Function Tests: Recent Labs  Lab 03/06/24 9047 03/07/24 1753  AST 60* 38  ALT 25 23  ALKPHOS 78 72  BILITOT 0.7 0.4  PROT 8.1 7.6  ALBUMIN  3.5 3.2*   No results for input(s): LIPASE, AMYLASE in the last 168 hours. No results for input(s): AMMONIA in the last 168 hours.  CBC: Recent Labs  Lab 03/06/24 0952 03/07/24 0415 03/08/24 0338 03/09/24 0356 03/11/24 1000  WBC 19.2* 11.5* 11.9* 14.3* 14.8*  NEUTROABS  --   --  8.6* 11.6* 11.6*  HGB 9.4* 9.1* 8.9* 9.2* 8.7*  HCT 30.5* 29.2* 30.0* 30.9* 28.8*  MCV 74.9* 74.9* 76.1* 76.1* 75.2*  PLT 189 154 159 176 333    Cardiac Enzymes: No results for input(s): CKTOTAL, CKMB, CKMBINDEX, TROPONINI in the last 168 hours.  BNP: Invalid input(s): POCBNP  CBG: Recent Labs  Lab 03/10/24 1159 03/10/24 1648 03/10/24 2204 03/11/24 0819 03/11/24 1234  GLUCAP 114* 120* 120* 98 114*    Microbiology: Results for orders placed or performed during the hospital encounter of 03/06/24  Resp panel by RT-PCR (RSV, Flu A&B, Covid) Anterior Nasal Swab     Status: None   Collection Time: 03/06/24  9:26 AM   Specimen: Anterior Nasal Swab  Result Value Ref Range Status   SARS Coronavirus 2 by RT PCR NEGATIVE NEGATIVE Final    Comment: (NOTE) SARS-CoV-2 target nucleic acids are NOT DETECTED.  The SARS-CoV-2 RNA is generally detectable in upper respiratory specimens during the acute phase of infection. The lowest concentration of SARS-CoV-2 viral copies this assay can detect is 138 copies/mL. A negative result does not preclude SARS-Cov-2 infection and should not be used as the sole basis for treatment or other patient management decisions. A negative result may occur with  improper specimen collection/handling, submission of specimen other than nasopharyngeal swab, presence of viral mutation(s) within the areas targeted by this assay, and inadequate number of viral copies(<138 copies/mL). A negative result must be combined with clinical observations, patient history, and epidemiological information. The expected result is Negative.  Fact Sheet for Patients:   bloggercourse.com  Fact Sheet for Healthcare Providers:  seriousbroker.it  This test is no t yet approved or cleared by the [place]  FDA and  has been authorized for detection and/or diagnosis of SARS-CoV-2 by FDA under an Emergency Use Authorization (EUA). This EUA will remain  in effect (meaning this test can be used) for the duration of the COVID-19 declaration under Section 564(b)(1) of the Act, 21 U.S.C.section 360bbb-3(b)(1), unless the authorization is terminated  or revoked sooner.       Influenza A by PCR NEGATIVE NEGATIVE Final   Influenza B by PCR NEGATIVE NEGATIVE Final    Comment: (NOTE) The Xpert Xpress SARS-CoV-2/FLU/RSV plus assay is intended as an aid in the diagnosis of influenza from Nasopharyngeal swab specimens and should not be used as a sole basis for treatment. Nasal washings and aspirates are unacceptable for Xpert Xpress SARS-CoV-2/FLU/RSV testing.  Fact Sheet for Patients: bloggercourse.com  Fact Sheet for Healthcare Providers: seriousbroker.it  This test is not yet approved or cleared by the [place]  FDA and has been authorized for detection and/or diagnosis of SARS-CoV-2 by FDA under an Emergency Use Authorization (EUA). This EUA will remain in effect (meaning this test can be used) for the duration of the COVID-19 declaration under Section 564(b)(1) of the Act, 21 U.S.C. section 360bbb-3(b)(1), unless the authorization is terminated or revoked.     Resp Syncytial Virus by PCR NEGATIVE NEGATIVE Final    Comment: (NOTE) Fact Sheet for Patients: bloggercourse.com  Fact Sheet for Healthcare Providers: seriousbroker.it  This test is not yet approved or cleared by the [place]  FDA and has been authorized for detection and/or diagnosis of SARS-CoV-2 by FDA under an Emergency Use  Authorization (EUA). This EUA will remain in effect (meaning this test can be used) for the duration of the COVID-19 declaration under Section 564(b)(1) of the Act, 21 U.S.C. section 360bbb-3(b)(1), unless the authorization is terminated or revoked.  Performed at [facility], [address] [place], [place] [address]   Blood culture (routine x 2)     Status: None   Collection Time: 03/06/24  9:52 AM   Specimen: BLOOD  Result Value Ref Range Status   Specimen Description BLOOD BLOOD RIGHT ARM  Final   Special Requests   Final    BOTTLES DRAWN AEROBIC AND ANAEROBIC Blood Culture results may not be optimal due to an inadequate volume of blood received in culture bottles   Culture   Final  NO GROWTH 5 DAYS Performed at [facility], [address] [place] [place], [place] [address]    Report Status 03/11/2024 FINAL  Final  Blood culture (routine x 2)     Status: None   Collection Time: 03/06/24  9:52 AM   Specimen: BLOOD  Result Value Ref Range Status   Specimen Description BLOOD BLOOD LEFT HAND  Final   Special Requests   Final    BOTTLES DRAWN AEROBIC AND ANAEROBIC Blood Culture adequate volume   Culture   Final    NO GROWTH 5 DAYS Performed at [facility], [address], [place], [place] [address]    Report Status 03/11/2024 FINAL  Final    Coagulation Studies: No results for input(s): LABPROT, INR in the last 72 hours.  Urinalysis: Recent Labs    03/11/24 0125  COLORURINE YELLOW*  LABSPEC 1.015  PHURINE 5.0  GLUCOSEU NEGATIVE  HGBUR MODERATE*  BILIRUBINUR NEGATIVE  KETONESUR NEGATIVE  PROTEINUR 30*  NITRITE NEGATIVE  LEUKOCYTESUR NEGATIVE      Imaging: DG Abd 1 View Result Date: 03/10/2024 EXAM: 1 VIEW XRAY OF THE ABDOMEN 03/10/2024 07:13:00 AM COMPARISON: 03/09/2024 CLINICAL HISTORY: Ileus (HCC) 01250 FINDINGS: LINES, TUBES AND DEVICES: Enteric tube in place with tip and side port projecting over the gastric body. BOWEL:  Gaseous distension of loops of small bowel and colon. SOFT TISSUES: No abnormal calcifications. BONES: No acute fracture. IMPRESSION: 1. Enteric tube in place with marked gaseous distension of small bowel and colon. Electronically signed by: Michaeline Blanch MD 03/10/2024 11:18 AM EST RP Workstation: [id number]   DG Abd 1 View Result Date: 03/09/2024 EXAM: 1 VIEW XRAY OF THE ABDOMEN 03/09/2024 09:49:09 PM COMPARISON: None available. CLINICAL HISTORY: Encounter for nasogastric (NG) tube placement FINDINGS: LINES, TUBES AND DEVICES: Enteric tube in place with distal tip and side port terminating within the expected location of the stomach. BOWEL: Dilated loops of small bowel in visualized upper abdomen. SOFT TISSUES: No abnormal calcifications. BONES: No acute fracture. IMPRESSION: 1. Dilated loops of small bowel in the visualized upper abdomen. 2. Enteric tube in place with distal tip and side port terminating within the expected location of the stomach. Electronically signed by: Greig Pique MD 03/09/2024 10:20 PM EST RP Workstation: [id number]   DG Chest 1 View Result Date: 03/09/2024 EXAM: 1 VIEW(S) XRAY OF THE CHEST 03/09/2024 09:49:09 PM COMPARISON: 03/06/2024 CLINICAL HISTORY: Encounter for nasogastric (NG) tube placement FINDINGS: LINES, TUBES AND DEVICES: The nasogastric tube tip overlies the upper midline mediastinum. LUNGS AND PLEURA: Low lung volume. Mild pulmonary edema, possibly accentuated by low lung volume. No focal pulmonary opacity. No pleural effusion. No pneumothorax. HEART AND MEDIASTINUM: Stable moderate cardiomegaly. The nasogastric tube tip overlies the upper midline mediastinum. BONES AND SOFT TISSUES: No acute osseous abnormality. IMPRESSION: 1. Enteric tube tip projects over the upper midline mediastinum; recommend advancement and repeat radiograph to confirm gastric positioning. 2. Mild pulmonary edema, possibly accentuated by low lung volume. 3. Stable moderate cardiomegaly.  Electronically signed by: Greig Pique MD 03/09/2024 10:19 PM EST RP Workstation: [id number]     Medications:    promethazine  (PHENERGAN ) injection (IM or IVPB) 12.5 mg (03/09/24 1419)    [START ON 03/12/2024] amLODipine   10 mg Oral Daily   famotidine   20 mg Oral BID   gabapentin   100 mg Oral TID   heparin   5,000 Units Subcutaneous Q8H   simethicone   80 mg Oral QID   sodium chloride  flush  3 mL Intravenous Q12H   acetaminophen  **OR**  acetaminophen , alum & mag hydroxide-simeth, ipratropium-albuterol , labetalol , methocarbamol  (ROBAXIN ) injection, ondansetron  **OR** ondansetron  (ZOFRAN ) IV, promethazine  (PHENERGAN ) injection (IM or IVPB), senna-docusate  Assessment/ Plan:  Mr. Darrell Campbell is a 33 y.o.  male  with hypertension, sleep apnea, obesity and anemia, who was admitted to [facility] on 03/06/2024 for Paroxysmal atrial fibrillation (HCC) [I48.0] SOB (shortness of breath) [R06.02] Upper respiratory tract infection, unspecified type [J06.9] Congestive heart failure, unspecified HF chronicity, unspecified heart failure type (HCC) [I50.9] Acute hypoxic respiratory failure (HCC) [J96.01]    Acute Kidney Injury on chronic kidney disease stage IIIB with proteinuria: baseline creatinine 2.1, GFR of 41. Renal ultrasound shows no obstruction. No recent IV contrast exposure. Concern for progression of chronic kidney disease. Losartan  held.  Creatinine continues to rise. Decent urine output. Concerned with increased creatinine and BUN. Denies uremic symptoms at this time. No immediate need for dialysis but discussed with patient it may be required if renal function does not improve. Will order Hepatitis B labs in am.    Lab Results  Component Value Date   CREATININE 5.43 (H) 03/11/2024   CREATININE 4.68 (H) 03/10/2024   CREATININE 3.80 (H) 03/09/2024     Intake/Output Summary (Last 24 hours) at 03/11/2024 1552 Last data filed at 03/11/2024 0630 Gross per 24 hour  Intake 243 ml  Output  555 ml  Net -312 ml      Hypertension with chronic kidney disease: with acute exacerbation of chronic diastolic congestive heart failure.  Home regimen of losartan , hydrochlorothiazide , and amlodipine . However unclear of his compliance.  IV diuretics as needed Remains on amlodipine .   Anemia on chronic kidney disease: with iron deficiency.  - oral iron supplements - do not recommend IV iron with active infection    Latest Reference Range & Units 03/09/24 03:56  HgB 13.0 - 17.0 g/dL 9.2 (L)  HCT 60.9 - 47.9 % 30.9 (L)  MCV 80.0 - 100.0 fL 76.1 (L)  MCH 26.0 - 34.0 pg 22.7 (L)  MCHC 30.0 - 36.0 g/dL 70.1 (L)  RDW 88.4 - 84.4 % 17.7 (H)    Hypokalemia: on hydrochlorothiazide  as outpatient. Magnesium  level replaced.  - Potassium 3.5, Continue to monitor.          LOS: 5 Gunner Iodice 12/30/20253:52 PM  "

## 2024-03-12 LAB — GLUCOSE, CAPILLARY
Glucose-Capillary: 95 mg/dL (ref 70–99)
Glucose-Capillary: 92 mg/dL (ref 70–99)
Glucose-Capillary: 77 mg/dL (ref 70–99)
Glucose-Capillary: 99 mg/dL (ref 70–99)

## 2024-03-12 LAB — BASIC METABOLIC PANEL WITH GFR
Anion gap: 16 — ABNORMAL HIGH (ref 5–15)
BUN: 67 mg/dL — ABNORMAL HIGH (ref 6–20)
CO2: 24 mmol/L (ref 22–32)
Calcium: 9.9 mg/dL (ref 8.9–10.3)
Chloride: 98 mmol/L (ref 98–111)
Creatinine, Ser: 6.42 mg/dL — ABNORMAL HIGH (ref 0.61–1.24)
GFR, Estimated: 11 mL/min — ABNORMAL LOW
Glucose, Bld: 94 mg/dL (ref 70–99)
Potassium: 3.1 mmol/L — ABNORMAL LOW (ref 3.5–5.1)
Sodium: 137 mmol/L (ref 135–145)

## 2024-03-12 LAB — HEPATITIS B SURFACE ANTIGEN: Hepatitis B Surface Ag: NONREACTIVE

## 2024-03-12 MED ORDER — POTASSIUM CHLORIDE CRYS ER 20 MEQ PO TBCR: 40.0000 meq | EXTENDED_RELEASE_TABLET | Freq: Once | ORAL | Status: DC

## 2024-03-12 MED ORDER — POTASSIUM CHLORIDE CRYS ER 20 MEQ PO TBCR
20.0000 meq | EXTENDED_RELEASE_TABLET | Freq: Once | ORAL | Status: AC
  Administered 2024-03-12: 20 meq via ORAL
  Filled 2024-03-12: qty 1

## 2024-03-12 NOTE — Progress Notes (Signed)
 Physical Therapy Treatment Patient Details Name: Darrell Campbell MRN: [id number] DOB: 09-19-1990 Today's Date: 03/12/2024   History of Present Illness Darrell Campbell is a 33yoM who comes to [facility] on 12/25 with 3 days URI symptoms, LLE edema. PMH: HTN, OSA, hypoferritinemia.  BNP: 3831, pt febrile, RVP negative.    PT Comments  Patient is agreeable to PT session. NG tube has been removed and patient reports having clear liquids today. Increased independence with mobility and progressing of activity tolerance this session. He walked 83ft in the room with CGA without assistive device. No physical assistance required for mobility. Cues for energy conservation and monitoring for need for rest breaks. Patient reports his plan is to discharge back home. PT will continue to follow to maximize independence and facilitate return to prior level of function.    If plan is discharge home, recommend the following: Help with stairs or ramp for entrance;A little help with walking and/or transfers;A little help with bathing/dressing/bathroom   Can travel by private vehicle        Equipment Recommendations  Rolling walker (2 wheels)    Recommendations for Other Services       Precautions / Restrictions Precautions Precautions: Fall Recall of Precautions/Restrictions: Intact Restrictions Weight Bearing Restrictions Per Provider Order: No     Mobility  Bed Mobility Overal bed mobility: Needs Assistance Bed Mobility: Supine to Sit     Supine to sit: Contact guard     General bed mobility comments: increased time required    Transfers Overall transfer level: Needs assistance Equipment used: Rolling walker (2 wheels) Transfers: Sit to/from Stand Sit to Stand: Contact guard assist           General transfer comment: bariatric rolling walker used for transfer from bed and from bed side commode. no physical assistance required.    Ambulation/Gait Ambulation/Gait assistance: Contact  guard assist Gait Distance (Feet):  (32ft, 81ft) Assistive device: None, Rolling walker (2 wheels) Gait Pattern/deviations: Step-through pattern Gait velocity: decreased     General Gait Details: patient walked 5 ft with rolling walker to bed side commode. after toileting, patient walked without rolling walker short distance in the room. cues for monitoring for signs of fatigue and need for rest breaks for endurance and fall prevention. Sp02 95% on room air after walking   Stairs             Wheelchair Mobility     Tilt Bed    Modified Rankin (Stroke Patients Only)       Balance Overall balance assessment: Needs assistance Sitting-balance support: Feet supported Sitting balance-Leahy Scale: Good     Standing balance support: No upper extremity supported Standing balance-Leahy Scale: Fair                              Hotel Manager: No apparent difficulties  Cognition Arousal: Alert Behavior During Therapy: WFL for tasks assessed/performed   PT - Cognitive impairments: No apparent impairments                         Following commands: Intact      Cueing Cueing Techniques: Verbal cues  Exercises      General Comments        Pertinent Vitals/Pain Pain Assessment Pain Assessment: Faces Faces Pain Scale: Hurts a little bit Pain Location: L foot Pain Descriptors / Indicators: Discomfort Pain Intervention(s): Limited activity within patient's  tolerance    Home Living                          Prior Function            PT Goals (current goals can now be found in the care plan section) Acute Rehab PT Goals Patient Stated Goal: to go home PT Goal Formulation: With patient Progress towards PT goals: Progressing toward goals    Frequency    Min 2X/week      PT Plan      Co-evaluation PT/OT/SLP Co-Evaluation/Treatment: Yes Reason for Co-Treatment: For patient/therapist safety (low  activity tolerance) PT goals addressed during session: Mobility/safety with mobility        AM-PAC PT 6 Clicks Mobility   Outcome Measure  Help needed turning from your back to your side while in a flat bed without using bedrails?: A Little Help needed moving from lying on your back to sitting on the side of a flat bed without using bedrails?: A Little Help needed moving to and from a bed to a chair (including a wheelchair)?: A Little Help needed standing up from a chair using your arms (e.g., wheelchair or bedside chair)?: A Little Help needed to walk in hospital room?: A Little Help needed climbing 3-5 steps with a railing? : A Lot 6 Click Score: 17    End of Session   Activity Tolerance: Patient tolerated treatment well Patient left: in chair;with call bell/phone within reach   PT Visit Diagnosis: Unsteadiness on feet (R26.81);Other abnormalities of gait and mobility (R26.89);Muscle weakness (generalized) (M62.81)     Time: 8879-8850 PT Time Calculation (min) (ACUTE ONLY): 29 min  Charges:    $Therapeutic Activity: 8-22 mins PT General Charges $$ ACUTE PT VISIT: 1 Visit                    Randine Essex, PT, MPT    Randine LULLA Essex 03/12/2024, 1:02 PM

## 2024-03-12 NOTE — TOC Progression Note (Addendum)
 Transition of Care [facility]) - Progression Note    Patient Details  Name: Darrell Campbell MRN: [id number] Date of Birth: 1990/09/06  [facility] CM/SW Contact  Shasta DELENA Daring, RN Phone Number: 03/12/2024, 2:52 PM  Clinical Narrative:     Provided patient name and dob to [facility] for consideration of charity care. Will follow  5:00 PM Per Leita at [facility], they have accepted his case for charity care and will provide [facility] services. Will advise them when discharging. Will require order.                   Expected Discharge Plan and Services                                               Social Drivers of Health (SDOH) Interventions SDOH Screenings   Food Insecurity: No Food Insecurity (03/06/2024)  Housing: Unknown (03/06/2024)  Transportation Needs: No Transportation Needs (03/06/2024)  Utilities: Not At Risk (03/06/2024)  Social Connections: Patient Declined (03/06/2024)  Tobacco Use: Low Risk (03/06/2024)    Readmission Risk Interventions     No data to display

## 2024-03-12 NOTE — Progress Notes (Addendum)
 "    Progress Note    Darrell Campbell  FMW:[id number] DOB: 24-Apr-1990  DOA: 03/06/2024 PCP: Osker Tinnie HERO, FNP      Brief Narrative:    Medical records reviewed and are as summarized below:  Darrell Campbell is a 33 y.o. male with medical history of hypertension, class III obesity, OSA, iron deficiency anemia, who presented to the hospital with worsening shortness of breath after event URI symptoms.  Labs significant for WBC 19.2, proBNP 3831, troponin 134, creatinine 4.26 on admission.   CT chest IMPRESSION: 1. Mosaic ground-glass opacity in the dependent lung bases may be atelectasis or sequelae of small airways disease. 2. Hepatic steatosis.      Assessment/Plan:   Principal Problem:   Acute hypoxic respiratory failure (HCC) Active Problems:   Essential hypertension   Morbid obesity (HCC)   Iron deficiency anemia   Lymphedema   Prediabetes   OSA (obstructive sleep apnea)   Vitamin D  deficiency   AKI (acute kidney injury)   Hypokalemia   Body mass index is 68.87 kg/m.  (Class III obesity)   Abdominal pain, nausea and vomiting, ileus: Consulted general surgeon to assist with management.  Patient had a bowel movement today.  Surgeon recommended removing NG tube and starting clear liquid diet.  Appreciate input from general surgeon   Acute hypoxic respiratory failure: Improved.  On 2 L oxygen.  Wean off oxygen as able.   Acute heart failure with preserved EF: S/p treatment with IV Lasix . proBNP 3831. 2D echo showed EF estimated at 77 5%, grade 2 diastolic dysfunction.   AKI on CKD stage IIIb: Worsening AKI.  Creatinine up from 4.68-5.43-6.42. Case discussed with Dr. Dennise, nephrologist.  Follow-up with nephrologist for further recommendations.   Hypokalemia: Replete potassium and monitor levels.   Comorbidities include hypertension, microcytic anemia, chronic lymphedema of left lower extremity, OSA on CPAP at night   Diet Order              Diet full liquid Room service appropriate? Yes; Fluid consistency: Thin  Diet effective now                                  Consultants: Nephrologist General Surgeon  Procedures: None    Medications:    amLODipine   10 mg Oral Daily   famotidine   20 mg Oral BID   gabapentin   100 mg Oral TID   heparin   5,000 Units Subcutaneous Q8H   simethicone   80 mg Oral QID   sodium chloride  flush  3 mL Intravenous Q12H   Continuous Infusions:  promethazine  (PHENERGAN ) injection (IM or IVPB) 12.5 mg (03/09/24 1419)     Anti-infectives (From admission, onward)    Start     Dose/Rate Route Frequency Ordered Stop   03/10/24 0000  azithromycin  (ZITHROMAX ) tablet 500 mg        500 mg Oral Daily 03/09/24 1135 03/10/24 2352   03/07/24 0000  cefTRIAXone  (ROCEPHIN ) 2 g in sodium chloride  0.9 % 100 mL IVPB        2 g 200 mL/hr over 30 Minutes Intravenous Every 24 hours 03/06/24 2314 03/11/24 0145   03/07/24 0000  azithromycin  (ZITHROMAX ) 500 mg in sodium chloride  0.9 % 250 mL IVPB  Status:  Discontinued        500 mg 250 mL/hr over 60 Minutes Intravenous Every 24 hours 03/06/24 2314 03/09/24 1135  Family Communication/Anticipated D/C date and plan/Code Status   DVT prophylaxis: heparin  injection 5,000 Units Start: 03/06/24 1400     Code Status: Full Code  Family Communication: Plan discussed with his mother at the bedside Disposition Plan: Plan to discharge home   Status is: Inpatient Remains inpatient appropriate because: Worsening AKI       Subjective:   Interval events noted.  He feels better today.  Abdomen feels less bloated.  He has tolerated a liquid diet.  He had a bowel movement today.  Mother at the bedside  Objective:    Vitals:   03/12/24 0358 03/12/24 0518 03/12/24 0818 03/12/24 1230  BP: (!) 155/86  (!) 158/88 (!) 150/92  Pulse: 75  61 87  Resp: 18  (!) 22   Temp: 98.1 F (36.7 C)  97.6 F (36.4 C) 98.1 F (36.7  C)  TempSrc:      SpO2: 97%  96% 100%  Weight:  (!) 230.3 kg    Height:       No data found.   Intake/Output Summary (Last 24 hours) at 03/12/2024 1327 Last data filed at 03/12/2024 1030 Gross per 24 hour  Intake 480 ml  Output 2850 ml  Net -2370 ml   Filed Weights   03/10/24 0500 03/11/24 0500 03/12/24 0518  Weight: (!) 174.6 kg (!) 174.2 kg (!) 230.3 kg    Exam:  GEN: NAD SKIN: Warm and dry EYES: No pallor or icterus ENT: MMM CV: RRR PULM: CTA B ABD: soft, grossly protuberant, NT, +BS CNS: AAO x 3, non focal EXT: Bilateral leg edema (left greater than right)        Data Reviewed:   I have personally reviewed following labs and imaging studies:  Labs: Labs show the following:   Basic Metabolic Panel: Recent Labs  Lab 03/06/24 1533 03/06/24 1752 03/07/24 1753 03/08/24 0338 03/09/24 0356 03/10/24 0442 03/11/24 1000 03/12/24 0344  NA  --    < >  --  138 141 144 138 137  K  --    < >  --  3.2* 3.2* 3.6 3.5 3.1*  CL  --    < >  --  101 103 104 99 98  CO2  --    < >  --  24 23 23 23 24   GLUCOSE  --    < >  --  112* 110* 103* 117* 94  BUN  --    < >  --  44* 44* 56* 64* 67*  CREATININE  --    < >  --  3.94* 3.80* 4.68* 5.43* 6.42*  CALCIUM   --    < >  --  8.6* 9.4 9.9 9.4 9.9  MG 1.5*  --  2.1  --   --   --   --   --   PHOS  --   --  3.7  --   --   --   --   --    < > = values in this interval not displayed.   GFR Estimated Creatinine Clearance: 31.5 mL/min (A) (by C-G formula based on SCr of 6.42 mg/dL (H)). Liver Function Tests: Recent Labs  Lab 03/06/24 9047 03/07/24 1753  AST 60* 38  ALT 25 23  ALKPHOS 78 72  BILITOT 0.7 0.4  PROT 8.1 7.6  ALBUMIN 3.5 3.2*   No results for input(s): LIPASE, AMYLASE in the last 168 hours. No results for input(s): AMMONIA in the last 168 hours. Coagulation  profile No results for input(s): INR, PROTIME in the last 168 hours.  CBC: Recent Labs  Lab 03/06/24 0952 03/07/24 0415  03/08/24 0338 03/09/24 0356 03/11/24 1000  WBC 19.2* 11.5* 11.9* 14.3* 14.8*  NEUTROABS  --   --  8.6* 11.6* 11.6*  HGB 9.4* 9.1* 8.9* 9.2* 8.7*  HCT 30.5* 29.2* 30.0* 30.9* 28.8*  MCV 74.9* 74.9* 76.1* 76.1* 75.2*  PLT 189 154 159 176 333   Cardiac Enzymes: No results for input(s): CKTOTAL, CKMB, CKMBINDEX, TROPONINI in the last 168 hours. BNP (last 3 results) Recent Labs    03/06/24 0952  PROBNP 3,831.0*   CBG: Recent Labs  Lab 03/11/24 1234 03/11/24 1709 03/11/24 2100 03/12/24 0817 03/12/24 1121  GLUCAP 114* 77 86 95 92   D-Dimer: No results for input(s): DDIMER in the last 72 hours. Hgb A1c: No results for input(s): HGBA1C in the last 72 hours. Lipid Profile: No results for input(s): CHOL, HDL, LDLCALC, TRIG, CHOLHDL, LDLDIRECT in the last 72 hours. Thyroid  function studies: No results for input(s): TSH, T4TOTAL, T3FREE, THYROIDAB in the last 72 hours.  Invalid input(s): FREET3 Anemia work up: No results for input(s): VITAMINB12, FOLATE, FERRITIN, TIBC, IRON, RETICCTPCT in the last 72 hours. Sepsis Labs: Recent Labs  Lab 03/06/24 9047 03/07/24 0415 03/08/24 0338 03/09/24 0356 03/11/24 1000  WBC 19.2* 11.5* 11.9* 14.3* 14.8*  LATICACIDVEN 1.6  --   --   --   --     Microbiology Recent Results (from the past 240 hours)  Resp panel by RT-PCR (RSV, Flu A&B, Covid) Anterior Nasal Swab     Status: None   Collection Time: 03/06/24  9:26 AM   Specimen: Anterior Nasal Swab  Result Value Ref Range Status   SARS Coronavirus 2 by RT PCR NEGATIVE NEGATIVE Final    Comment: (NOTE) SARS-CoV-2 target nucleic acids are NOT DETECTED.  The SARS-CoV-2 RNA is generally detectable in upper respiratory specimens during the acute phase of infection. The lowest concentration of SARS-CoV-2 viral copies this assay can detect is 138 copies/mL. A negative result does not preclude SARS-Cov-2 infection and should not be used as the  sole basis for treatment or other patient management decisions. A negative result may occur with  improper specimen collection/handling, submission of specimen other than nasopharyngeal swab, presence of viral mutation(s) within the areas targeted by this assay, and inadequate number of viral copies(<138 copies/mL). A negative result must be combined with clinical observations, patient history, and epidemiological information. The expected result is Negative.  Fact Sheet for Patients:  bloggercourse.com  Fact Sheet for Healthcare Providers:  seriousbroker.it  This test is no t yet approved or cleared by the [place]  FDA and  has been authorized for detection and/or diagnosis of SARS-CoV-2 by FDA under an Emergency Use Authorization (EUA). This EUA will remain  in effect (meaning this test can be used) for the duration of the COVID-19 declaration under Section 564(b)(1) of the Act, 21 U.S.C.section 360bbb-3(b)(1), unless the authorization is terminated  or revoked sooner.       Influenza A by PCR NEGATIVE NEGATIVE Final   Influenza B by PCR NEGATIVE NEGATIVE Final    Comment: (NOTE) The Xpert Xpress SARS-CoV-2/FLU/RSV plus assay is intended as an aid in the diagnosis of influenza from Nasopharyngeal swab specimens and should not be used as a sole basis for treatment. Nasal washings and aspirates are unacceptable for Xpert Xpress SARS-CoV-2/FLU/RSV testing.  Fact Sheet for Patients: bloggercourse.com  Fact Sheet for Healthcare Providers: seriousbroker.it  This test is not yet approved or cleared by the [place]  FDA and has been authorized for detection and/or diagnosis of SARS-CoV-2 by FDA under an Emergency Use Authorization (EUA). This EUA will remain in effect (meaning this test can be used) for the duration of the COVID-19 declaration under Section 564(b)(1) of the  Act, 21 U.S.C. section 360bbb-3(b)(1), unless the authorization is terminated or revoked.     Resp Syncytial Virus by PCR NEGATIVE NEGATIVE Final    Comment: (NOTE) Fact Sheet for Patients: bloggercourse.com  Fact Sheet for Healthcare Providers: seriousbroker.it  This test is not yet approved or cleared by the [place]  FDA and has been authorized for detection and/or diagnosis of SARS-CoV-2 by FDA under an Emergency Use Authorization (EUA). This EUA will remain in effect (meaning this test can be used) for the duration of the COVID-19 declaration under Section 564(b)(1) of the Act, 21 U.S.C. section 360bbb-3(b)(1), unless the authorization is terminated or revoked.  Performed at [facility], [address] [place], [place] [address]   Blood culture (routine x 2)     Status: None   Collection Time: 03/06/24  9:52 AM   Specimen: BLOOD  Result Value Ref Range Status   Specimen Description BLOOD BLOOD RIGHT ARM  Final   Special Requests   Final    BOTTLES DRAWN AEROBIC AND ANAEROBIC Blood Culture results may not be optimal due to an inadequate volume of blood received in culture bottles   Culture   Final    NO GROWTH 5 DAYS Performed at [facility], [address], [place], [place] [address]    Report Status 03/11/2024 FINAL  Final  Blood culture (routine x 2)     Status: None   Collection Time: 03/06/24  9:52 AM   Specimen: BLOOD  Result Value Ref Range Status   Specimen Description BLOOD BLOOD LEFT HAND  Final   Special Requests   Final    BOTTLES DRAWN AEROBIC AND ANAEROBIC Blood Culture adequate volume   Culture   Final    NO GROWTH 5 DAYS Performed at [facility], [address] [place], [place] [address]    Report Status 03/11/2024 FINAL  Final    Procedures and diagnostic studies:  DG Abd 1 View Result Date: 03/11/2024 CLINICAL DATA:  Ileus. EXAM: ABDOMEN - 1 VIEW  COMPARISON:  Radiographs yesterday FINDINGS: Stable gaseous distention of large and small bowel. No change from yesterday's exam. Enteric tube remains in place. Cholecystectomy clips in the right upper quadrant. No obvious free air. IMPRESSION: Unchanged gaseous distention of large and small bowel, consistent with ileus. Electronically Signed   By: Andrea Gasman M.D.   On: 03/11/2024 16:06               LOS: 6 days   Yuan Gann  [facility]   Pager on [email or web address]. If 7PM-7AM, please contact night-coverage at [email or web address]     03/12/2024, 1:27 PM           "

## 2024-03-12 NOTE — Progress Notes (Signed)
 Occupational Therapy Treatment Patient Details Name: Darrell Campbell MRN: [id number] DOB: Jan 11, 1991 Today's Date: 03/12/2024   History of present illness Darrell Campbell is a 33yoM who comes [facility] on 12/25 with 3 days URI symptoms, LLE edema. PMH: HTN, OSA, hypoferritinemia.  BNP: 3831, pt febrile, RVP negative.   OT comments  Upon entering the room, pt supine in bed and agreeable to therapeutic intervention after some encouragement. Pt performing supine >sit with min A for L LE. Pt stands with RW and CGA. Pt ambulates 10' to [facility] for BM. Pt able to void and stands for hygiene but reports not being able to complete himself. Total A for hygiene. Pt then ambulating 15' without use of RW with CGA and no LOB while on RA. Pt remains at or above 90% throughout session with functional mobility. Call bell and all needed items within reach. Pt remains seated in recliner chair at end of session.       If plan is discharge home, recommend the following:  A lot of help with walking and/or transfers;A lot of help with bathing/dressing/bathroom;Assistance with cooking/housework;Help with stairs or ramp for entrance;Assist for transportation;Direct supervision/assist for financial management;Direct supervision/assist for medications management   Equipment Recommendations  Other (comment) (bariatric BSC)       Precautions / Restrictions Precautions Precautions: Fall Recall of Precautions/Restrictions: Intact Restrictions Weight Bearing Restrictions Per Provider Order: No       Mobility Bed Mobility Overal bed mobility: Needs Assistance Bed Mobility: Supine to Sit     Supine to sit: Min assist          Transfers Overall transfer level: Needs assistance Equipment used: Rolling walker (2 wheels) Transfers: Sit to/from Stand Sit to Stand: Contact guard assist                 Balance Overall balance assessment: Needs assistance Sitting-balance support: Feet supported Sitting  balance-Leahy Scale: Good     Standing balance support: No upper extremity supported Standing balance-Leahy Scale: Fair                             ADL either performed or assessed with clinical judgement   ADL Overall ADL's : Needs assistance/impaired     Grooming: Wash/dry hands;Set up;Sitting                   Toilet Transfer: Supervision/safety;Rolling walker (2 wheels);BSC/3in1   Toileting- Clothing Manipulation and Hygiene: Minimal assistance;Sit to/from stand Toileting - Clothing Manipulation Details (indicate cue type and reason): assistance for hygiene while pt stands            Extremity/Trunk Assessment Upper Extremity Assessment Upper Extremity Assessment: Generalized weakness   Lower Extremity Assessment Lower Extremity Assessment: Generalized weakness;LLE deficits/detail LLE Deficits / Details: lymphadema        Vision Baseline Vision/History: 1 Wears glasses Ability to See in Adequate Light: 0 Adequate           Communication Communication Communication: No apparent difficulties   Cognition Arousal: Alert Behavior During Therapy: WFL for tasks assessed/performed Cognition: No apparent impairments                               Following commands: Intact        Cueing   Cueing Techniques: Verbal cues             Pertinent Vitals/ Pain  Pain Assessment Pain Assessment: Faces Faces Pain Scale: Hurts a little bit Pain Location: L foot Pain Descriptors / Indicators: Discomfort Pain Intervention(s): Monitored during session         Frequency  Min 2X/week        Progress Toward Goals  OT Goals(current goals can now be found in the care plan section)  Progress towards OT goals: Progressing toward goals         Co-evaluation      Reason for Co-Treatment: For patient/therapist safety PT goals addressed during session: Mobility/safety with mobility        AM-PAC OT 6 Clicks Daily  Activity     Outcome Measure   Help from another person eating meals?: None Help from another person taking care of personal grooming?: None Help from another person toileting, which includes using toliet, bedpan, or urinal?: A Lot Help from another person bathing (including washing, rinsing, drying)?: A Lot Help from another person to put on and taking off regular upper body clothing?: A Little Help from another person to put on and taking off regular lower body clothing?: A Little 6 Click Score: 18    End of Session Equipment Utilized During Treatment: Rolling walker (2 wheels)  OT Visit Diagnosis: Unsteadiness on feet (R26.81);Repeated falls (R29.6);Muscle weakness (generalized) (M62.81)   Activity Tolerance Patient tolerated treatment well   Patient Left in chair;with call bell/phone within reach   Nurse Communication Mobility status        Time: 8879-8850 OT Time Calculation (min): 29 min  Charges: OT General Charges $OT Visit: 1 Visit OT Treatments $Self Care/Home Management : 8-22 mins  Izetta Claude, MS, OTR/L , CBIS ascom [phone]  03/12/2024, 1:49 PM

## 2024-03-12 NOTE — Consult Note (Addendum)
 [facility]  SURGICAL CONSULTATION NOTE    HISTORY OF PRESENT ILLNESS (HPI):  33 y.o. male with a medical history of morbid obesity, hypertension, prediabetes, venous stasis of lower extremities and lymphedema, presented to [facility] ED 03/06/2024 for evaluation of shortness of breath associated with myalgias, fatigue, chills and cough x 2 days.  EMS placed patient on 6 L nasal cannula for hypoxia and was able to go down to 2-3 L in the ED. EKG showed findings consistent with new onset A-fib with RVR with a HR of 145 which appears he went back on normal sinus rhythm before getting IV Cardizem . Patient was otherwise HD stable.  Labs indicated leukocytosis 19.2 and microcytic anemia with hbg 9.4.  Patient had elevated BUN 40 and creatinine levels 4.26. BNP was 3,831.0. Elevated troponin 134 and repeat troponin levels 136. Elevated d-dimer 5.59. Venous US  negative for DVT.  Normal lactic acid levels. Blood cultures have been negative. COVID, flu and RSV negative. Chest x-ray was negative for acute findings but did note cardiomegaly and some volume overload. Given elevated BNP levels, echocardiogram was obtained which showed HFpEF 70 to 75% with grade 2 diastolic dysfunction. Patient was receiving IV Lasix . However, nephrologist advise to hold due to worsening renal function.   Patient started experiencing abdominal discomfort on 12/27 and started experiencing nausea and vomiting on 12/28. CT of abdomen from 12/28 showed dilated mid to distal small bowel with air-fluid levels with no, findings consistent with ileus.  NG tube was placed and per chart, patient started feeling better.  Abdominal x-ray from yesterday still shows findings consistent with ileus. Currently on 2L [place].  Latest labs indicate leukocytosis 14.8.  Surgery is consulted by Dr. Jens in this context for evaluation and management of ileus.  PAST MEDICAL HISTORY (PMH):  Past Medical History:  Diagnosis Date   COVID-19     03/2020   Gout    HTN (hypertension)    Hypertension    Morbid obesity (HCC)    Prediabetes      PAST SURGICAL HISTORY (PSH):  Past Surgical History:  Procedure Laterality Date   NO PAST SURGERIES       MEDICATIONS:  Prior to Admission medications  Medication Sig Start Date End Date Taking? Authorizing Provider  amLODipine  (NORVASC ) 10 MG tablet TAKE 1 TABLET(10 MG) BY MOUTH DAILY Patient not taking: Reported on 03/06/2024 05/17/23   Malvina Alm DASEN, MD  Cholecalciferol  1.25 MG (50000 UT) capsule Take 1 capsule (50,000 Units total) by mouth once a week. 1x per week D3 Patient not taking: Reported on 03/06/2024 10/04/20   McLean-Scocuzza, Randine SAILOR, MD  ferrous sulfate  325 (65 FE) MG tablet Take 1 tablet (325 mg total) by mouth daily with breakfast. Patient not taking: Reported on 03/06/2024 08/07/18   Osker Tinnie HERO, FNP  folic acid  (FOLVITE ) 400 MCG tablet Take 1 tablet (400 mcg total) by mouth daily. Patient not taking: Reported on 03/06/2024 08/07/18   Osker Tinnie HERO, FNP  losartan -hydrochlorothiazide  (HYZAAR) 50-12.5 MG tablet Take 1 tablet by mouth daily. Patient not taking: Reported on 03/06/2024 05/17/23   Malvina Alm DASEN, MD  metoprolol  succinate (TOPROL -XL) 50 MG 24 hr tablet TAKE 1 TABLET BY MOUTH EVERY DAY WITH OR IMMEDIATELY FOLLOWING A MEAL Patient not taking: Reported on 03/06/2024 05/17/23   Malvina Alm DASEN, MD  potassium chloride  SA (KLOR-CON  M) 20 MEQ tablet Take 1 tablet (20 mEq total) by mouth 2 (two) times daily for 4 days. 05/17/23 05/21/23  Malvina Alm DASEN,  MD  Vitamin D , Ergocalciferol , (DRISDOL ) 1.25 MG (50000 UT) CAPS capsule Take 1 capsule (50,000 Units total) by mouth every 7 (seven) days. Patient not taking: Reported on 03/06/2024 08/07/18   Osker Tinnie HERO, FNP     ALLERGIES:  Allergies[1]   SOCIAL HISTORY:  Social History   Socioeconomic History   Marital status: Single    Spouse name: Not on file   Number of children: Not on file   Years of education: Not on file    Highest education level: Not on file  Occupational History   Not on file  Tobacco Use   Smoking status: Never   Smokeless tobacco: Never  Vaping Use   Vaping status: Never Used  Substance and Sexual Activity   Alcohol use: Yes    Comment: Occassionally   Drug use: Never   Sexual activity: Yes    Partners: Male  Other Topics Concern   Not on file  Social History Narrative   ** Merged History Encounter **       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 Degree 04/2016    Social Drivers of Health   Tobacco Use: Low Risk (03/06/2024)   Patient History    Smoking Tobacco Use: Never    Smokeless Tobacco Use: Never    Passive Exposure: Not on file  Financial Resource Strain: Not on file  Food Insecurity: No Food Insecurity (03/06/2024)   Epic    Worried About Programme Researcher, Broadcasting/film/video in the Last Year: Never true    Ran Out of Food in the Last Year: Never true  Transportation Needs: No Transportation Needs (03/06/2024)   Epic    Lack of Transportation (Medical): No    Lack of Transportation (Non-Medical): No  Physical Activity: Not on file  Stress: Not on file  Social Connections: Patient Declined (03/06/2024)   Social Connection and Isolation Panel    Frequency of Communication with Friends and Family: Patient declined    Frequency of Social Gatherings with Friends and Family: Patient declined    Attends Religious Services: Patient declined    Database Administrator or Organizations: Patient declined    Attends Banker Meetings: Patient declined    Marital Status: Patient declined  Intimate Partner Violence: Not At Risk (03/06/2024)   Epic    Fear of Current or Ex-Partner: No    Emotionally Abused: No    Physically Abused: No    Sexually Abused: No  Depression (PHQ2-9): Not on file  Alcohol Screen: Not on file  Housing: Unknown (03/06/2024)   Epic    Unable to Pay for Housing in the Last Year: No    Number of Times Moved in the Last Year: Not  on file    Homeless in the Last Year: No  Utilities: Not At Risk (03/06/2024)   Epic    Threatened with loss of utilities: No  Health Literacy: Not on file     FAMILY HISTORY:  Family History  Problem Relation Age of Onset   Hypertension Mother    Alcohol abuse Father    Hypertension Sister    Hypertension Maternal Grandmother    Diabetes Maternal Grandmother    Cancer Maternal Grandmother        breast   Arthritis Maternal Grandmother    Alcohol abuse Maternal Grandfather    Bronchitis Mother    Hypertension Father       REVIEW OF SYSTEMS:  Review of Systems  Constitutional:  Negative  for chills and fever.  Respiratory:  Negative for cough and shortness of breath.   Cardiovascular:  Negative for chest pain and palpitations.  Gastrointestinal:  Negative for abdominal pain, nausea and vomiting.    VITAL SIGNS:  Temp:  [97.6 F (36.4 C)-98.8 F (37.1 C)] 97.6 F (36.4 C) (12/31 0818) Pulse Rate:  [61-87] 61 (12/31 0818) Resp:  [18-22] 22 (12/31 0818) BP: (117-158)/(65-88) 158/88 (12/31 0818) SpO2:  [95 %-100 %] 96 % (12/31 0818) Weight:  [230.3 kg] 230.3 kg (12/31 0518)     Height: 6' (182.9 cm) Weight: (!) 230.3 kg BMI (Calculated): 68.86   INTAKE/OUTPUT:  12/30 0701 - 12/31 0700 In: 0  Out: 2100 [Urine:1400; Emesis/NG output:700]  PHYSICAL EXAM:  Physical Exam Constitutional:      Appearance: He is obese.  HENT:     Head: Normocephalic and atraumatic.  Cardiovascular:     Rate and Rhythm: Normal rate and regular rhythm.  Pulmonary:     Breath sounds: Wheezing and rhonchi present.     Comments: On 2L [place] Abdominal:     General: There is distension.     Palpations: Abdomen is soft.     Tenderness: There is no abdominal tenderness. There is no rebound.     Comments: Non-tympanic on exam, given patient's habitus, exam is not reliable.      Labs:     Latest Ref Rng & Units 03/11/2024   10:00 AM 03/09/2024    3:56 AM 03/08/2024    3:38 AM  CBC  WBC  4.0 - 10.5 K/uL 14.8  14.3  11.9   Hemoglobin 13.0 - 17.0 g/dL 8.7  9.2  8.9   Hematocrit 39.0 - 52.0 % 28.8  30.9  30.0   Platelets 150 - 400 K/uL 333  176  159       Latest Ref Rng & Units 03/12/2024    3:44 AM 03/11/2024   10:00 AM 03/10/2024    4:42 AM  CMP  Glucose 70 - 99 mg/dL 94  882  896   BUN 6 - 20 mg/dL 67  64  56   Creatinine 0.61 - 1.24 mg/dL 3.57  4.56  5.31   Sodium 135 - 145 mmol/L 137  138  144   Potassium 3.5 - 5.1 mmol/L 3.1  3.5  3.6   Chloride 98 - 111 mmol/L 98  99  104   CO2 22 - 32 mmol/L 24  23  23    Calcium  8.9 - 10.3 mg/dL 9.9  9.4  9.9     Imaging studies:  EXAM: CT ABDOMEN AND PELVIS WITHOUT CONTRAST 03/09/2024 01:33:31 PM   TECHNIQUE: CT of the abdomen and pelvis was performed without the administration of intravenous contrast. Multiplanar reformatted images are provided for review. Automated exposure control, iterative reconstruction, and/or weight-based adjustment of the mA/kV was utilized to reduce the radiation dose to as low as reasonably achievable.   COMPARISON: None available.   CLINICAL HISTORY: Abdominal pain, acute, nonlocalized.   FINDINGS:   LOWER CHEST: Bibasilar atelectasis versus scarring.   LIVER: The liver is unremarkable.   GALLBLADDER AND BILE DUCTS: Status post cholecystectomy. No biliary ductal dilatation.   SPLEEN: No acute abnormality.   PANCREAS: No acute abnormality.   ADRENAL GLANDS: No acute abnormality.   KIDNEYS, URETERS AND BLADDER: No stones in the kidneys or ureters. No hydronephrosis. No perinephric or periureteral stranding. Urinary bladder is unremarkable.   GI AND BOWEL: The gastric lumen is distended. The mid to  distal small bowel are dilated with gas and fluid. The small bowel measures up to 4 cm. Air-fluid levels are noted. No pneumatosis. No bowel thickening. The ascending colon and transverse colon are distended but not dilated with stool and gas. The descending colon  and rectosigmoid colon are decompressed. No transition point. There is no bowel obstruction.   PERITONEUM AND RETROPERITONEUM: No ascites. No free air.   VASCULATURE: Aorta is normal in caliber.   LYMPH NODES: No lymphadenopathy.   REPRODUCTIVE ORGANS: Prostate unremarkable.   BONES AND SOFT TISSUES: No acute osseous abnormality. Small to moderate volume umbilical fat-containing hernia.   IMPRESSION: 1. Distended gastric lumen and dilated mid to distal small bowel with air-fluid levels, without pneumatosis or bowel wall thickening; no transition point identified, most consistent with ileus. Limited evaluation on this noncontrast study. 2. Small to moderate fat-containing umbilical hernia.   Electronically signed by: Morgane Naveau MD 03/09/2024 02:16 PM EST RP Workstation: [id number]   CLINICAL DATA:  Ileus.   EXAM: ABDOMEN - 1 VIEW   COMPARISON:  Radiographs yesterday   FINDINGS: Stable gaseous distention of large and small bowel. No change from yesterday's exam. Enteric tube remains in place. Cholecystectomy clips in the right upper quadrant. No obvious free air.   IMPRESSION: Unchanged gaseous distention of large and small bowel, consistent with ileus.     Electronically Signed   By: Andrea Gasman M.D.   On: 03/11/2024 16:06  Assessment/Plan: 33 y.o. male with ileus, complicated by pertinent comorbidities including HFpEF, AKI on CKD stage IIIb, lymphedema, essential hypertension, and morbidly obese.   - Stable vital signs, no fever not tachycardic.  Currently on 2L [place].  Leukocytosis 14.8.  On exam patient denied any abdominal discomfort. Appears distended but difficult to assess given patient's body habitus. Reports have had 2 bowel movements yesterday and is passing gas. Denies any nausea or vomiting. States last vomiting episode was 3 days ago. Appears to be improving clinically.   - Plan to discontinue NG tube and advance to full liquid diet.  Will  advance to regular diet as tolerate.    - Continue Gas-X  - Surgery will continue to follow  - Continue to follow hospitalist and nephrologist recommendations  Thank you for the opportunity to participate in this patient's care.   -- Keryl Gholson Barrientos PA-C      [1]  Allergies Allergen Reactions   Yellow Jacket Venom Swelling

## 2024-03-12 NOTE — Progress Notes (Signed)
 " [facility]  ROUNDING NOTE   Subjective:   Patient resting in bed States he would feel better if NGT was removed Denies nausea Tolerating clear liquid diet, reports his appetite has returned.   Adequate urine output, 1.4L Creatinine 6.42 (5.43) (4.68) (3.8)  Objective:  Vital signs in last 24 hours:  Temp:  [97.6 F (36.4 C)-98.8 F (37.1 C)] 98.1 F (36.7 C) (12/31 1230) Pulse Rate:  [61-87] 87 (12/31 1230) Resp:  [18-22] 22 (12/31 0818) BP: (117-158)/(65-92) 150/92 (12/31 1230) SpO2:  [95 %-100 %] 100 % (12/31 1230) Weight:  [230.3 kg] 230.3 kg (12/31 0518)  Weight change: 56.1 kg Filed Weights   03/10/24 0500 03/11/24 0500 03/12/24 0518  Weight: (!) 174.6 kg (!) 174.2 kg (!) 230.3 kg    Intake/Output: I/O last 3 completed shifts: In: 243 [P.O.:240; I.V.:3] Out: 2405 [Urine:1700; Emesis/NG output:705]   Intake/Output this shift:  Total I/O In: 480 [P.O.:480] Out: 750 [Urine:300; Emesis/NG output:450]  Physical Exam: General: Large body habitus  Head: Normocephalic, NGT  Eyes: Anicteric  Lungs:  Diminished  Heart: Regular rate   Abdomen:  Soft, nontender, obese  Extremities:  +++ peripheral edema, L>R  Neurologic: alert  Skin: No lesions  Access: None    Basic Metabolic Panel: Recent Labs  Lab 03/06/24 1533 03/06/24 1752 03/07/24 1753 03/08/24 0338 03/09/24 0356 03/10/24 0442 03/11/24 1000 03/12/24 0344  NA  --    < >  --  138 141 144 138 137  K  --    < >  --  3.2* 3.2* 3.6 3.5 3.1*  CL  --    < >  --  101 103 104 99 98  CO2  --    < >  --  24 23 23 23 24   GLUCOSE  --    < >  --  112* 110* 103* 117* 94  BUN  --    < >  --  44* 44* 56* 64* 67*  CREATININE  --    < >  --  3.94* 3.80* 4.68* 5.43* 6.42*  CALCIUM   --    < >  --  8.6* 9.4 9.9 9.4 9.9  MG 1.5*  --  2.1  --   --   --   --   --   PHOS  --   --  3.7  --   --   --   --   --    < > = values in this interval not displayed.    Liver Function Tests: Recent Labs  Lab  03/06/24 9047 03/07/24 1753  AST 60* 38  ALT 25 23  ALKPHOS 78 72  BILITOT 0.7 0.4  PROT 8.1 7.6  ALBUMIN 3.5 3.2*   No results for input(s): LIPASE, AMYLASE in the last 168 hours. No results for input(s): AMMONIA in the last 168 hours.  CBC: Recent Labs  Lab 03/06/24 0952 03/07/24 0415 03/08/24 0338 03/09/24 0356 03/11/24 1000  WBC 19.2* 11.5* 11.9* 14.3* 14.8*  NEUTROABS  --   --  8.6* 11.6* 11.6*  HGB 9.4* 9.1* 8.9* 9.2* 8.7*  HCT 30.5* 29.2* 30.0* 30.9* 28.8*  MCV 74.9* 74.9* 76.1* 76.1* 75.2*  PLT 189 154 159 176 333    Cardiac Enzymes: No results for input(s): CKTOTAL, CKMB, CKMBINDEX, TROPONINI in the last 168 hours.  BNP: Invalid input(s): POCBNP  CBG: Recent Labs  Lab 03/11/24 1234 03/11/24 1709 03/11/24 2100 03/12/24 0817 03/12/24 1121  GLUCAP 114* 77 86  95 92    Microbiology: Results for orders placed or performed during the hospital encounter of 03/06/24  Resp panel by RT-PCR (RSV, Flu A&B, Covid) Anterior Nasal Swab     Status: None   Collection Time: 03/06/24  9:26 AM   Specimen: Anterior Nasal Swab  Result Value Ref Range Status   SARS Coronavirus 2 by RT PCR NEGATIVE NEGATIVE Final    Comment: (NOTE) SARS-CoV-2 target nucleic acids are NOT DETECTED.  The SARS-CoV-2 RNA is generally detectable in upper respiratory specimens during the acute phase of infection. The lowest concentration of SARS-CoV-2 viral copies this assay can detect is 138 copies/mL. A negative result does not preclude SARS-Cov-2 infection and should not be used as the sole basis for treatment or other patient management decisions. A negative result may occur with  improper specimen collection/handling, submission of specimen other than nasopharyngeal swab, presence of viral mutation(s) within the areas targeted by this assay, and inadequate number of viral copies(<138 copies/mL). A negative result must be combined with clinical observations, patient  history, and epidemiological information. The expected result is Negative.  Fact Sheet for Patients:  bloggercourse.com  Fact Sheet for Healthcare Providers:  seriousbroker.it  This test is no t yet approved or cleared by the [place]  FDA and  has been authorized for detection and/or diagnosis of SARS-CoV-2 by FDA under an Emergency Use Authorization (EUA). This EUA will remain  in effect (meaning this test can be used) for the duration of the COVID-19 declaration under Section 564(b)(1) of the Act, 21 U.S.C.section 360bbb-3(b)(1), unless the authorization is terminated  or revoked sooner.       Influenza A by PCR NEGATIVE NEGATIVE Final   Influenza B by PCR NEGATIVE NEGATIVE Final    Comment: (NOTE) The Xpert Xpress SARS-CoV-2/FLU/RSV plus assay is intended as an aid in the diagnosis of influenza from Nasopharyngeal swab specimens and should not be used as a sole basis for treatment. Nasal washings and aspirates are unacceptable for Xpert Xpress SARS-CoV-2/FLU/RSV testing.  Fact Sheet for Patients: bloggercourse.com  Fact Sheet for Healthcare Providers: seriousbroker.it  This test is not yet approved or cleared by the [place]  FDA and has been authorized for detection and/or diagnosis of SARS-CoV-2 by FDA under an Emergency Use Authorization (EUA). This EUA will remain in effect (meaning this test can be used) for the duration of the COVID-19 declaration under Section 564(b)(1) of the Act, 21 U.S.C. section 360bbb-3(b)(1), unless the authorization is terminated or revoked.     Resp Syncytial Virus by PCR NEGATIVE NEGATIVE Final    Comment: (NOTE) Fact Sheet for Patients: bloggercourse.com  Fact Sheet for Healthcare Providers: seriousbroker.it  This test is not yet approved or cleared by the [place]  FDA  and has been authorized for detection and/or diagnosis of SARS-CoV-2 by FDA under an Emergency Use Authorization (EUA). This EUA will remain in effect (meaning this test can be used) for the duration of the COVID-19 declaration under Section 564(b)(1) of the Act, 21 U.S.C. section 360bbb-3(b)(1), unless the authorization is terminated or revoked.  Performed at [facility], [address] [place], [place] [address]   Blood culture (routine x 2)     Status: None   Collection Time: 03/06/24  9:52 AM   Specimen: BLOOD  Result Value Ref Range Status   Specimen Description BLOOD BLOOD RIGHT ARM  Final   Special Requests   Final    BOTTLES DRAWN AEROBIC AND ANAEROBIC Blood Culture results may not be optimal  due to an inadequate volume of blood received in culture bottles   Culture   Final    NO GROWTH 5 DAYS Performed at [facility], [address] [place] [place], [place] [address]    Report Status 03/11/2024 FINAL  Final  Blood culture (routine x 2)     Status: None   Collection Time: 03/06/24  9:52 AM   Specimen: BLOOD  Result Value Ref Range Status   Specimen Description BLOOD BLOOD LEFT HAND  Final   Special Requests   Final    BOTTLES DRAWN AEROBIC AND ANAEROBIC Blood Culture adequate volume   Culture   Final    NO GROWTH 5 DAYS Performed at [facility], [address], [place], [place] [address]    Report Status 03/11/2024 FINAL  Final    Coagulation Studies: No results for input(s): LABPROT, INR in the last 72 hours.  Urinalysis: Recent Labs    03/11/24 0125  COLORURINE YELLOW*  LABSPEC 1.015  PHURINE 5.0  GLUCOSEU NEGATIVE  HGBUR MODERATE*  BILIRUBINUR NEGATIVE  KETONESUR NEGATIVE  PROTEINUR 30*  NITRITE NEGATIVE  LEUKOCYTESUR NEGATIVE      Imaging: DG Abd 1 View Result Date: 03/11/2024 CLINICAL DATA:  Ileus. EXAM: ABDOMEN - 1 VIEW COMPARISON:  Radiographs yesterday FINDINGS: Stable gaseous distention of large and small  bowel. No change from yesterday's exam. Enteric tube remains in place. Cholecystectomy clips in the right upper quadrant. No obvious free air. IMPRESSION: Unchanged gaseous distention of large and small bowel, consistent with ileus. Electronically Signed   By: Andrea Gasman M.D.   On: 03/11/2024 16:06     Medications:    promethazine  (PHENERGAN ) injection (IM or IVPB) 12.5 mg (03/09/24 1419)    amLODipine   10 mg Oral Daily   famotidine   20 mg Oral BID   gabapentin   100 mg Oral TID   heparin   5,000 Units Subcutaneous Q8H   simethicone   80 mg Oral QID   sodium chloride  flush  3 mL Intravenous Q12H   acetaminophen  **OR** acetaminophen , alum & mag hydroxide-simeth, ipratropium-albuterol , labetalol , lidocaine , methocarbamol  (ROBAXIN ) injection, ondansetron  **OR** ondansetron  (ZOFRAN ) IV, promethazine  (PHENERGAN ) injection (IM or IVPB), senna-docusate  Assessment/ Plan:  Mr. Darrell Campbell is a 33 y.o.  male  with hypertension, sleep apnea, obesity and anemia, who was admitted to [facility] on 03/06/2024 for Paroxysmal atrial fibrillation (HCC) [I48.0] SOB (shortness of breath) [R06.02] Upper respiratory tract infection, unspecified type [J06.9] Congestive heart failure, unspecified HF chronicity, unspecified heart failure type (HCC) [I50.9] Acute hypoxic respiratory failure (HCC) [J96.01]    Acute Kidney Injury on chronic kidney disease stage IIIB with proteinuria: baseline creatinine 2.1, GFR of 41. Renal ultrasound shows no obstruction. No recent IV contrast exposure. Concern for progression of chronic kidney disease. Losartan  held.  Creatinine continues to rise, 6.42 today. Producing adequate urine output. Discussed with patient that if no improvements by Friday, we will proceed with hemodialysis. Patient is agreeable. Will monitor labs and clinical presentation closely. Denies uremic symptoms at this time.    Lab Results  Component Value Date   CREATININE 6.42 (H) 03/12/2024   CREATININE  5.43 (H) 03/11/2024   CREATININE 4.68 (H) 03/10/2024     Intake/Output Summary (Last 24 hours) at 03/12/2024 1400 Last data filed at 03/12/2024 1030 Gross per 24 hour  Intake 480 ml  Output 2850 ml  Net -2370 ml      Hypertension with chronic kidney disease: with acute exacerbation of chronic diastolic congestive heart failure.  Home regimen of losartan , hydrochlorothiazide , and amlodipine . However unclear of his compliance.  IV diuretics as needed Remains on amlodipine .   Anemia on chronic kidney disease: with iron deficiency.  - oral iron supplements - do not recommend IV iron with active infection    Latest Reference Range & Units 03/09/24 03:56  HgB 13.0 - 17.0 g/dL 9.2 (L)  HCT 60.9 - 47.9 % 30.9 (L)  MCV 80.0 - 100.0 fL 76.1 (L)  MCH 26.0 - 34.0 pg 22.7 (L)  MCHC 30.0 - 36.0 g/dL 70.1 (L)  RDW 88.4 - 84.4 % 17.7 (H)    Hypokalemia: on hydrochlorothiazide  as outpatient. Magnesium  level replaced.  - Potassium 3.1 today. Oral supplementation ordered by primary team         LOS: 6 Darrell Campbell 12/31/20252:00 PM  "

## 2024-03-12 NOTE — Plan of Care (Signed)

## 2024-03-13 LAB — BASIC METABOLIC PANEL WITH GFR
Anion gap: 16 — ABNORMAL HIGH (ref 5–15)
BUN: 75 mg/dL — ABNORMAL HIGH (ref 6–20)
CO2: 22 mmol/L (ref 22–32)
Calcium: 9.3 mg/dL (ref 8.9–10.3)
Chloride: 99 mmol/L (ref 98–111)
Creatinine, Ser: 5.75 mg/dL — ABNORMAL HIGH (ref 0.61–1.24)
GFR, Estimated: 12 mL/min — ABNORMAL LOW
Glucose, Bld: 115 mg/dL — ABNORMAL HIGH (ref 70–99)
Potassium: 3.2 mmol/L — ABNORMAL LOW (ref 3.5–5.1)
Sodium: 137 mmol/L (ref 135–145)

## 2024-03-13 LAB — GLUCOSE, CAPILLARY
Glucose-Capillary: 94 mg/dL (ref 70–99)
Glucose-Capillary: 124 mg/dL — ABNORMAL HIGH (ref 70–99)
Glucose-Capillary: 103 mg/dL — ABNORMAL HIGH (ref 70–99)
Glucose-Capillary: 95 mg/dL (ref 70–99)

## 2024-03-13 LAB — CBC
HCT: 26.7 % — ABNORMAL LOW (ref 39.0–52.0)
Hemoglobin: 8.3 g/dL — ABNORMAL LOW (ref 13.0–17.0)
MCH: 23 pg — ABNORMAL LOW (ref 26.0–34.0)
MCHC: 31.1 g/dL (ref 30.0–36.0)
MCV: 74 fL — ABNORMAL LOW (ref 80.0–100.0)
Platelets: 387 K/uL (ref 150–400)
RBC: 3.61 MIL/uL — ABNORMAL LOW (ref 4.22–5.81)
RDW: 17.8 % — ABNORMAL HIGH (ref 11.5–15.5)
WBC: 18 K/uL — ABNORMAL HIGH (ref 4.0–10.5)
nRBC: 0 % (ref 0.0–0.2)

## 2024-03-13 LAB — HEPATITIS B SURFACE ANTIBODY, QUANTITATIVE: Hep B S AB Quant (Post): <3.5 m[IU]/mL — ABNORMAL LOW

## 2024-03-13 MED ORDER — POTASSIUM CHLORIDE CRYS ER 20 MEQ PO TBCR
40.0000 meq | EXTENDED_RELEASE_TABLET | Freq: Once | ORAL | Status: AC
  Administered 2024-03-13: 40 meq via ORAL
  Filled 2024-03-13: qty 2

## 2024-03-13 NOTE — Progress Notes (Signed)
 "    Progress Note    Darrell Campbell  FMW:[id number] DOB: June 30, 1990  DOA: 03/06/2024 PCP: Osker Tinnie HERO, FNP      Brief Narrative:    Medical records reviewed and are as summarized below:  Darrell Campbell is a 34 y.o. male with medical history of hypertension, class III obesity, OSA, iron deficiency anemia, who presented to the hospital with worsening shortness of breath after event URI symptoms.  Labs significant for WBC 19.2, proBNP 3831, troponin 134, creatinine 4.26 on admission.   CT chest IMPRESSION: 1. Mosaic ground-glass opacity in the dependent lung bases may be atelectasis or sequelae of small airways disease. 2. Hepatic steatosis.      Assessment/Plan:   Principal Problem:   Acute hypoxic respiratory failure (HCC) Active Problems:   Essential hypertension   Morbid obesity (HCC)   Iron deficiency anemia   Lymphedema   Prediabetes   OSA (obstructive sleep apnea)   Vitamin D  deficiency   AKI (acute kidney injury)   Hypokalemia   Body mass index is 68.87 kg/m.  (Class III obesity)   Abdominal pain, nausea and vomiting, ileus: Improved.  He is tolerating regular diet.  He moved his bowels.   Appreciate input from general surgeon   Acute hypoxic respiratory failure: Improved.  He thinks he may need home oxygen and CPAP at home.   Wean off oxygen and check overnight oximetry.   Acute heart failure with preserved EF: S/p treatment with IV Lasix . proBNP 3831. 2D echo showed EF estimated at 77 5%, grade 2 diastolic dysfunction.   AKI on CKD stage IIIb: Creatinine down from 6.42-5.75.  Creatinine had gone up from 4.68-5.43-6.42. Follow-up with nephrologist for further recommendations.   Hypokalemia: Potassium from 3.1-3.2.  Continue potassium repletion and monitor levels.   Worsening leukocytosis: WBC up from 14.8-18.  Etiology unclear.  Continue to monitor.   Morbid obesity, suspected OSA: Patient said that he believes he needs oxygen  at night.  He said he had not done a formal sleep study before.  I strongly recommended outpatient follow-up for sleep study so that he can be fitted with a CPAP use at home.   Comorbidities include hypertension, microcytic anemia, chronic lymphedema of left lower extremity   Diet Order             Diet regular Room service appropriate? Yes; Fluid consistency: Thin  Diet effective now                                  Consultants: Nephrologist General Surgeon  Procedures: None    Medications:    amLODipine   10 mg Oral Daily   famotidine   20 mg Oral BID   gabapentin   100 mg Oral TID   heparin   5,000 Units Subcutaneous Q8H   simethicone   80 mg Oral QID   sodium chloride  flush  3 mL Intravenous Q12H   Continuous Infusions:  promethazine  (PHENERGAN ) injection (IM or IVPB) 12.5 mg (03/09/24 1419)     Anti-infectives (From admission, onward)    Start     Dose/Rate Route Frequency Ordered Stop   03/10/24 0000  azithromycin  (ZITHROMAX ) tablet 500 mg        500 mg Oral Daily 03/09/24 1135 03/10/24 2352   03/07/24 0000  cefTRIAXone  (ROCEPHIN ) 2 g in sodium chloride  0.9 % 100 mL IVPB        2 g 200 mL/hr  over 30 Minutes Intravenous Every 24 hours 03/06/24 2314 03/11/24 0145   03/07/24 0000  azithromycin  (ZITHROMAX ) 500 mg in sodium chloride  0.9 % 250 mL IVPB  Status:  Discontinued        500 mg 250 mL/hr over 60 Minutes Intravenous Every 24 hours 03/06/24 2314 03/09/24 1135              Family Communication/Anticipated D/C date and plan/Code Status   DVT prophylaxis: heparin  injection 5,000 Units Start: 03/06/24 1400     Code Status: Full Code  Family Communication: Plan discussed with his mother at the bedside Disposition Plan: Plan to discharge home   Status is: Inpatient Remains inpatient appropriate because: Worsening AKI       Subjective:   Interval events noted.  No nausea, vomiting or abdominal pain.  He moved his bowels  last night.  He is tolerating regular diet.  Objective:    Vitals:   03/12/24 1638 03/12/24 2111 03/13/24 0808 03/13/24 1128  BP: (!) 160/104 (!) 145/87 (!) 131/96 123/77  Pulse: 87 79 (!) 102 100  Resp:  (!) 22  19  Temp: 98.3 F (36.8 C) 98.4 F (36.9 C) 98 F (36.7 C) 98.9 F (37.2 C)  TempSrc:   Oral Oral  SpO2: 100% 100% 93% 98%  Weight:      Height:       No data found.   Intake/Output Summary (Last 24 hours) at 03/13/2024 1432 Last data filed at 03/13/2024 0836 Gross per 24 hour  Intake 600 ml  Output --  Net 600 ml   Filed Weights   03/10/24 0500 03/11/24 0500 03/12/24 0518  Weight: (!) 174.6 kg (!) 174.2 kg (!) 230.3 kg    Exam:   GEN: NAD SKIN: Warm and dry EYES: No pallor or icterus ENT: MMM CV: RRR PULM: CTA B ABD: soft, protuberant, NT, +BS CNS: AAO x 3, non focal EXT: Left lower extremity lymphedema      Data Reviewed:   I have personally reviewed following labs and imaging studies:  Labs: Labs show the following:   Basic Metabolic Panel: Recent Labs  Lab 03/06/24 1533 03/06/24 1752 03/07/24 1753 03/08/24 0338 03/09/24 0356 03/10/24 0442 03/11/24 1000 03/12/24 0344 03/13/24 0340  NA  --    < >  --    < > 141 144 138 137 137  K  --    < >  --    < > 3.2* 3.6 3.5 3.1* 3.2*  CL  --    < >  --    < > 103 104 99 98 99  CO2  --    < >  --    < > 23 23 23 24 22   GLUCOSE  --    < >  --    < > 110* 103* 117* 94 115*  BUN  --    < >  --    < > 44* 56* 64* 67* 75*  CREATININE  --    < >  --    < > 3.80* 4.68* 5.43* 6.42* 5.75*  CALCIUM   --    < >  --    < > 9.4 9.9 9.4 9.9 9.3  MG 1.5*  --  2.1  --   --   --   --   --   --   PHOS  --   --  3.7  --   --   --   --   --   --    < > =  values in this interval not displayed.   GFR Estimated Creatinine Clearance: 35.2 mL/min (A) (by C-G formula based on SCr of 5.75 mg/dL (H)). Liver Function Tests: Recent Labs  Lab 03/07/24 1753  AST 38  ALT 23  ALKPHOS 72  BILITOT 0.4  PROT 7.6   ALBUMIN 3.2*   No results for input(s): LIPASE, AMYLASE in the last 168 hours. No results for input(s): AMMONIA in the last 168 hours. Coagulation profile No results for input(s): INR, PROTIME in the last 168 hours.  CBC: Recent Labs  Lab 03/07/24 0415 03/08/24 0338 03/09/24 0356 03/11/24 1000 03/13/24 0340  WBC 11.5* 11.9* 14.3* 14.8* 18.0*  NEUTROABS  --  8.6* 11.6* 11.6*  --   HGB 9.1* 8.9* 9.2* 8.7* 8.3*  HCT 29.2* 30.0* 30.9* 28.8* 26.7*  MCV 74.9* 76.1* 76.1* 75.2* 74.0*  PLT 154 159 176 333 387   Cardiac Enzymes: No results for input(s): CKTOTAL, CKMB, CKMBINDEX, TROPONINI in the last 168 hours. BNP (last 3 results) Recent Labs    03/06/24 0952  PROBNP 3,831.0*   CBG: Recent Labs  Lab 03/12/24 1121 03/12/24 1635 03/12/24 2026 03/13/24 0807 03/13/24 1129  GLUCAP 92 77 99 94 124*   D-Dimer: No results for input(s): DDIMER in the last 72 hours. Hgb A1c: No results for input(s): HGBA1C in the last 72 hours. Lipid Profile: No results for input(s): CHOL, HDL, LDLCALC, TRIG, CHOLHDL, LDLDIRECT in the last 72 hours. Thyroid  function studies: No results for input(s): TSH, T4TOTAL, T3FREE, THYROIDAB in the last 72 hours.  Invalid input(s): FREET3 Anemia work up: No results for input(s): VITAMINB12, FOLATE, FERRITIN, TIBC, IRON, RETICCTPCT in the last 72 hours. Sepsis Labs: Recent Labs  Lab 03/08/24 0338 03/09/24 0356 03/11/24 1000 03/13/24 0340  WBC 11.9* 14.3* 14.8* 18.0*    Microbiology Recent Results (from the past 240 hours)  Resp panel by RT-PCR (RSV, Flu A&B, Covid) Anterior Nasal Swab     Status: None   Collection Time: 03/06/24  9:26 AM   Specimen: Anterior Nasal Swab  Result Value Ref Range Status   SARS Coronavirus 2 by RT PCR NEGATIVE NEGATIVE Final    Comment: (NOTE) SARS-CoV-2 target nucleic acids are NOT DETECTED.  The SARS-CoV-2 RNA is generally detectable in upper  respiratory specimens during the acute phase of infection. The lowest concentration of SARS-CoV-2 viral copies this assay can detect is 138 copies/mL. A negative result does not preclude SARS-Cov-2 infection and should not be used as the sole basis for treatment or other patient management decisions. A negative result may occur with  improper specimen collection/handling, submission of specimen other than nasopharyngeal swab, presence of viral mutation(s) within the areas targeted by this assay, and inadequate number of viral copies(<138 copies/mL). A negative result must be combined with clinical observations, patient history, and epidemiological information. The expected result is Negative.  Fact Sheet for Patients:  bloggercourse.com  Fact Sheet for Healthcare Providers:  seriousbroker.it  This test is no t yet approved or cleared by the [place]  FDA and  has been authorized for detection and/or diagnosis of SARS-CoV-2 by FDA under an Emergency Use Authorization (EUA). This EUA will remain  in effect (meaning this test can be used) for the duration of the COVID-19 declaration under Section 564(b)(1) of the Act, 21 U.S.C.section 360bbb-3(b)(1), unless the authorization is terminated  or revoked sooner.       Influenza A by PCR NEGATIVE NEGATIVE Final   Influenza B by PCR NEGATIVE NEGATIVE Final  Comment: (NOTE) The Xpert Xpress SARS-CoV-2/FLU/RSV plus assay is intended as an aid in the diagnosis of influenza from Nasopharyngeal swab specimens and should not be used as a sole basis for treatment. Nasal washings and aspirates are unacceptable for Xpert Xpress SARS-CoV-2/FLU/RSV testing.  Fact Sheet for Patients: bloggercourse.com  Fact Sheet for Healthcare Providers: seriousbroker.it  This test is not yet approved or cleared by the [place]  FDA and has been  authorized for detection and/or diagnosis of SARS-CoV-2 by FDA under an Emergency Use Authorization (EUA). This EUA will remain in effect (meaning this test can be used) for the duration of the COVID-19 declaration under Section 564(b)(1) of the Act, 21 U.S.C. section 360bbb-3(b)(1), unless the authorization is terminated or revoked.     Resp Syncytial Virus by PCR NEGATIVE NEGATIVE Final    Comment: (NOTE) Fact Sheet for Patients: bloggercourse.com  Fact Sheet for Healthcare Providers: seriousbroker.it  This test is not yet approved or cleared by the [place]  FDA and has been authorized for detection and/or diagnosis of SARS-CoV-2 by FDA under an Emergency Use Authorization (EUA). This EUA will remain in effect (meaning this test can be used) for the duration of the COVID-19 declaration under Section 564(b)(1) of the Act, 21 U.S.C. section 360bbb-3(b)(1), unless the authorization is terminated or revoked.  Performed at [facility], [address] [place], [place] [address]   Blood culture (routine x 2)     Status: None   Collection Time: 03/06/24  9:52 AM   Specimen: BLOOD  Result Value Ref Range Status   Specimen Description BLOOD BLOOD RIGHT ARM  Final   Special Requests   Final    BOTTLES DRAWN AEROBIC AND ANAEROBIC Blood Culture results may not be optimal due to an inadequate volume of blood received in culture bottles   Culture   Final    NO GROWTH 5 DAYS Performed at [facility], [address] [place], [place] [address]    Report Status 03/11/2024 FINAL  Final  Blood culture (routine x 2)     Status: None   Collection Time: 03/06/24  9:52 AM   Specimen: BLOOD  Result Value Ref Range Status   Specimen Description BLOOD BLOOD LEFT HAND  Final   Special Requests   Final    BOTTLES DRAWN AEROBIC AND ANAEROBIC Blood Culture adequate volume   Culture   Final    NO GROWTH 5 DAYS Performed at  [facility], [address], [place], [place] [address]    Report Status 03/11/2024 FINAL  Final    Procedures and diagnostic studies:  No results found.              LOS: 7 days   Mar Walmer  [facility]   Pager on [email or web address]. If 7PM-7AM, please contact night-coverage at [email or web address]     03/13/2024, 2:32 PM           "

## 2024-03-13 NOTE — Progress Notes (Signed)
 Late entry: Communicated with patient concerning the order for overnight pulse oximetry study. Explained to patient that he could not wear CPAP while doing the study. Patient states he does not want to do the study, he prefers to wear the CPAP because he slept so well. Explained to patient this will be communicated to covering MD that he refuses overnight pulse oximetry study and he states he is fine with this

## 2024-03-13 NOTE — Progress Notes (Signed)
 " [facility]  ROUNDING NOTE   Subjective:   Patient sitting up in [facility] States he feels better with NGT removed Tolerating meals Denies nausea  Creatinine 5.75 (6.42) (5.43) (4.68) (3.8)  Objective:  Vital signs in last 24 hours:  Temp:  [98 F (36.7 C)-98.9 F (37.2 C)] 98.9 F (37.2 C) (01/01 1128) Pulse Rate:  [79-102] 100 (01/01 1128) Resp:  [19-22] 19 (01/01 1128) BP: (123-160)/(77-106) 123/77 (01/01 1128) SpO2:  [91 %-100 %] 98 % (01/01 1128)  Weight change:  Filed Weights   03/10/24 0500 03/11/24 0500 03/12/24 0518  Weight: (!) 174.6 kg (!) 174.2 kg (!) 230.3 kg    Intake/Output: I/O last 3 completed shifts: In: 1540 [P.O.:1540] Out: 1950 [Urine:800; Emesis/NG output:1150]   Intake/Output this shift:  Total I/O In: 360 [P.O.:360] Out: -   Physical Exam: General: Large body habitus  Head: Normocephalic  Eyes: Anicteric  Lungs:  Diminished  Heart: Regular rate   Abdomen:  Soft, nontender, obese  Extremities:  +++ peripheral edema, L>R  Neurologic: alert  Skin: No lesions  Access: None    Basic Metabolic Panel: Recent Labs  Lab 03/06/24 1533 03/06/24 1752 03/07/24 1753 03/08/24 0338 03/09/24 0356 03/10/24 0442 03/11/24 1000 03/12/24 0344 03/13/24 0340  NA  --    < >  --    < > 141 144 138 137 137  K  --    < >  --    < > 3.2* 3.6 3.5 3.1* 3.2*  CL  --    < >  --    < > 103 104 99 98 99  CO2  --    < >  --    < > 23 23 23 24 22   GLUCOSE  --    < >  --    < > 110* 103* 117* 94 115*  BUN  --    < >  --    < > 44* 56* 64* 67* 75*  CREATININE  --    < >  --    < > 3.80* 4.68* 5.43* 6.42* 5.75*  CALCIUM   --    < >  --    < > 9.4 9.9 9.4 9.9 9.3  MG 1.5*  --  2.1  --   --   --   --   --   --   PHOS  --   --  3.7  --   --   --   --   --   --    < > = values in this interval not displayed.    Liver Function Tests: Recent Labs  Lab 03/07/24 1753  AST 38  ALT 23  ALKPHOS 72  BILITOT 0.4  PROT 7.6  ALBUMIN 3.2*   No results  for input(s): LIPASE, AMYLASE in the last 168 hours. No results for input(s): AMMONIA in the last 168 hours.  CBC: Recent Labs  Lab 03/07/24 0415 03/08/24 0338 03/09/24 0356 03/11/24 1000 03/13/24 0340  WBC 11.5* 11.9* 14.3* 14.8* 18.0*  NEUTROABS  --  8.6* 11.6* 11.6*  --   HGB 9.1* 8.9* 9.2* 8.7* 8.3*  HCT 29.2* 30.0* 30.9* 28.8* 26.7*  MCV 74.9* 76.1* 76.1* 75.2* 74.0*  PLT 154 159 176 333 387    Cardiac Enzymes: No results for input(s): CKTOTAL, CKMB, CKMBINDEX, TROPONINI in the last 168 hours.  BNP: Invalid input(s): POCBNP  CBG: Recent Labs  Lab 03/12/24 1121 03/12/24 1635 03/12/24 2026 03/13/24 9192  03/13/24 1129  GLUCAP 92 77 99 94 124*    Microbiology: Results for orders placed or performed during the hospital encounter of 03/06/24  Resp panel by RT-PCR (RSV, Flu A&B, Covid) Anterior Nasal Swab     Status: None   Collection Time: 03/06/24  9:26 AM   Specimen: Anterior Nasal Swab  Result Value Ref Range Status   SARS Coronavirus 2 by RT PCR NEGATIVE NEGATIVE Final    Comment: (NOTE) SARS-CoV-2 target nucleic acids are NOT DETECTED.  The SARS-CoV-2 RNA is generally detectable in upper respiratory specimens during the acute phase of infection. The lowest concentration of SARS-CoV-2 viral copies this assay can detect is 138 copies/mL. A negative result does not preclude SARS-Cov-2 infection and should not be used as the sole basis for treatment or other patient management decisions. A negative result may occur with  improper specimen collection/handling, submission of specimen other than nasopharyngeal swab, presence of viral mutation(s) within the areas targeted by this assay, and inadequate number of viral copies(<138 copies/mL). A negative result must be combined with clinical observations, patient history, and epidemiological information. The expected result is Negative.  Fact Sheet for Patients:   bloggercourse.com  Fact Sheet for Healthcare Providers:  seriousbroker.it  This test is no t yet approved or cleared by the [place]  FDA and  has been authorized for detection and/or diagnosis of SARS-CoV-2 by FDA under an Emergency Use Authorization (EUA). This EUA will remain  in effect (meaning this test can be used) for the duration of the COVID-19 declaration under Section 564(b)(1) of the Act, 21 U.S.C.section 360bbb-3(b)(1), unless the authorization is terminated  or revoked sooner.       Influenza A by PCR NEGATIVE NEGATIVE Final   Influenza B by PCR NEGATIVE NEGATIVE Final    Comment: (NOTE) The Xpert Xpress SARS-CoV-2/FLU/RSV plus assay is intended as an aid in the diagnosis of influenza from Nasopharyngeal swab specimens and should not be used as a sole basis for treatment. Nasal washings and aspirates are unacceptable for Xpert Xpress SARS-CoV-2/FLU/RSV testing.  Fact Sheet for Patients: bloggercourse.com  Fact Sheet for Healthcare Providers: seriousbroker.it  This test is not yet approved or cleared by the [place]  FDA and has been authorized for detection and/or diagnosis of SARS-CoV-2 by FDA under an Emergency Use Authorization (EUA). This EUA will remain in effect (meaning this test can be used) for the duration of the COVID-19 declaration under Section 564(b)(1) of the Act, 21 U.S.C. section 360bbb-3(b)(1), unless the authorization is terminated or revoked.     Resp Syncytial Virus by PCR NEGATIVE NEGATIVE Final    Comment: (NOTE) Fact Sheet for Patients: bloggercourse.com  Fact Sheet for Healthcare Providers: seriousbroker.it  This test is not yet approved or cleared by the [place]  FDA and has been authorized for detection and/or diagnosis of SARS-CoV-2 by FDA under an Emergency Use  Authorization (EUA). This EUA will remain in effect (meaning this test can be used) for the duration of the COVID-19 declaration under Section 564(b)(1) of the Act, 21 U.S.C. section 360bbb-3(b)(1), unless the authorization is terminated or revoked.  Performed at [facility], [address] [place], [place] [address]   Blood culture (routine x 2)     Status: None   Collection Time: 03/06/24  9:52 AM   Specimen: BLOOD  Result Value Ref Range Status   Specimen Description BLOOD BLOOD RIGHT ARM  Final   Special Requests   Final    BOTTLES DRAWN AEROBIC AND ANAEROBIC  Blood Culture results may not be optimal due to an inadequate volume of blood received in culture bottles   Culture   Final    NO GROWTH 5 DAYS Performed at [facility], [address] [place], [place] [address]    Report Status 03/11/2024 FINAL  Final  Blood culture (routine x 2)     Status: None   Collection Time: 03/06/24  9:52 AM   Specimen: BLOOD  Result Value Ref Range Status   Specimen Description BLOOD BLOOD LEFT HAND  Final   Special Requests   Final    BOTTLES DRAWN AEROBIC AND ANAEROBIC Blood Culture adequate volume   Culture   Final    NO GROWTH 5 DAYS Performed at [facility] [address] [place], [place] [address]    Report Status 03/11/2024 FINAL  Final    Coagulation Studies: No results for input(s): LABPROT, INR in the last 72 hours.  Urinalysis: Recent Labs    03/11/24 0125  COLORURINE YELLOW*  LABSPEC 1.015  PHURINE 5.0  GLUCOSEU NEGATIVE  HGBUR MODERATE*  BILIRUBINUR NEGATIVE  KETONESUR NEGATIVE  PROTEINUR 30*  NITRITE NEGATIVE  LEUKOCYTESUR NEGATIVE      Imaging: No results found.    Medications:    promethazine  (PHENERGAN ) injection (IM or IVPB) 12.5 mg (03/09/24 1419)    amLODipine   10 mg Oral Daily   famotidine   20 mg Oral BID   gabapentin   100 mg Oral TID   heparin   5,000 Units Subcutaneous Q8H   simethicone   80 mg Oral QID    sodium chloride  flush  3 mL Intravenous Q12H   acetaminophen  **OR** acetaminophen , alum & mag hydroxide-simeth, ipratropium-albuterol , labetalol , lidocaine , methocarbamol  (ROBAXIN ) injection, ondansetron  **OR** ondansetron  (ZOFRAN ) IV, promethazine  (PHENERGAN ) injection (IM or IVPB), senna-docusate  Assessment/ Plan:  Darrell Campbell is a 34 y.o.  male  with hypertension, sleep apnea, obesity and anemia, who was admitted to [facility] on 03/06/2024 for Paroxysmal atrial fibrillation (HCC) [I48.0] SOB (shortness of breath) [R06.02] Upper respiratory tract infection, unspecified type [J06.9] Congestive heart failure, unspecified HF chronicity, unspecified heart failure type (HCC) [I50.9] Acute hypoxic respiratory failure (HCC) [J96.01]    Acute Kidney Injury on chronic kidney disease stage IIIB with proteinuria: baseline creatinine 2.1, GFR of 41. Renal ultrasound shows no obstruction. No recent IV contrast exposure. Concern for progression of chronic kidney disease. Losartan  held.  Creatinine has shown improvement today, 5.75. Tolerating a regular diet. Continue to monitor with adequate nutrition and treatment of underlying illness.   Lab Results  Component Value Date   CREATININE 5.75 (H) 03/13/2024   CREATININE 6.42 (H) 03/12/2024   CREATININE 5.43 (H) 03/11/2024     Intake/Output Summary (Last 24 hours) at 03/13/2024 1138 Last data filed at 03/13/2024 0836 Gross per 24 hour  Intake 1420 ml  Output --  Net 1420 ml      Hypertension with chronic kidney disease: with acute exacerbation of chronic diastolic congestive heart failure.  Home regimen of losartan , hydrochlorothiazide , and amlodipine . However unclear of his compliance.  IV diuretics as needed Remains on amlodipine . Blood pressure stable   Anemia on chronic kidney disease: with iron deficiency.  - oral iron supplements - do not recommend IV iron with active infection - Hgb remains acceptable for this patient     Latest  Reference Range & Units 03/09/24 03:56  HgB 13.0 - 17.0 g/dL 9.2 (L)  HCT 60.9 - 47.9 % 30.9 (L)  MCV 80.0 - 100.0 fL 76.1 (  L)  MCH 26.0 - 34.0 pg 22.7 (L)  MCHC 30.0 - 36.0 g/dL 70.1 (L)  RDW 88.4 - 84.4 % 17.7 (H)    Hypokalemia: on hydrochlorothiazide  as outpatient. Magnesium  level replaced.  - Potassium 3.2. Oral supplementation ordered by primary team         LOS: 7 Darrell Campbell 1/1/202611:38 AM  "

## 2024-03-13 NOTE — Plan of Care (Signed)
" °  Problem: Clinical Measurements: Goal: Cardiovascular complication will be avoided Outcome: Progressing   Problem: Nutrition: Goal: Adequate nutrition will be maintained Outcome: Progressing   Problem: Elimination: Goal: Will not experience complications related to bowel motility Outcome: Progressing Goal: Will not experience complications related to urinary retention Outcome: Progressing   Problem: Pain Managment: Goal: General experience of comfort will improve and/or be controlled Outcome: Progressing   Problem: Safety: Goal: Ability to remain free from injury will improve Outcome: Progressing   "

## 2024-03-14 LAB — RENAL FUNCTION PANEL
Albumin: 3.3 g/dL — ABNORMAL LOW (ref 3.5–5.0)
Anion gap: 17 — ABNORMAL HIGH (ref 5–15)
BUN: 82 mg/dL — ABNORMAL HIGH (ref 6–20)
CO2: 22 mmol/L (ref 22–32)
Calcium: 9.2 mg/dL (ref 8.9–10.3)
Chloride: 97 mmol/L — ABNORMAL LOW (ref 98–111)
Creatinine, Ser: 5.59 mg/dL — ABNORMAL HIGH (ref 0.61–1.24)
GFR, Estimated: 13 mL/min — ABNORMAL LOW
Glucose, Bld: 102 mg/dL — ABNORMAL HIGH (ref 70–99)
Phosphorus: 6.2 mg/dL — ABNORMAL HIGH (ref 2.5–4.6)
Potassium: 3 mmol/L — ABNORMAL LOW (ref 3.5–5.1)
Sodium: 137 mmol/L (ref 135–145)

## 2024-03-14 LAB — CBC
HCT: 27.7 % — ABNORMAL LOW (ref 39.0–52.0)
Hemoglobin: 8.4 g/dL — ABNORMAL LOW (ref 13.0–17.0)
MCH: 23 pg — ABNORMAL LOW (ref 26.0–34.0)
MCHC: 30.3 g/dL (ref 30.0–36.0)
MCV: 75.7 fL — ABNORMAL LOW (ref 80.0–100.0)
Platelets: 441 K/uL — ABNORMAL HIGH (ref 150–400)
RBC: 3.66 MIL/uL — ABNORMAL LOW (ref 4.22–5.81)
RDW: 18 % — ABNORMAL HIGH (ref 11.5–15.5)
WBC: 17.9 K/uL — ABNORMAL HIGH (ref 4.0–10.5)
nRBC: 0 % (ref 0.0–0.2)

## 2024-03-14 LAB — GLUCOSE, CAPILLARY
Glucose-Capillary: 130 mg/dL — ABNORMAL HIGH (ref 70–99)
Glucose-Capillary: 150 mg/dL — ABNORMAL HIGH (ref 70–99)
Glucose-Capillary: 99 mg/dL (ref 70–99)
Glucose-Capillary: 106 mg/dL — ABNORMAL HIGH (ref 70–99)

## 2024-03-14 LAB — MAGNESIUM: Magnesium: 2.2 mg/dL (ref 1.7–2.4)

## 2024-03-14 MED ORDER — POTASSIUM CHLORIDE CRYS ER 20 MEQ PO TBCR
30.0000 meq | EXTENDED_RELEASE_TABLET | Freq: Two times a day (BID) | ORAL | Status: AC
  Administered 2024-03-14 (×2): 30 meq via ORAL
  Filled 2024-03-14 (×2): qty 1

## 2024-03-14 NOTE — Progress Notes (Addendum)
 "    Progress Note    Darrell Campbell  FMW:[id number] DOB: [birth year]  DOA: 03/06/2024 PCP: Osker Tinnie HERO, FNP      Brief Narrative:    Medical records reviewed and are as summarized below:  Darrell Campbell is a 34 y.o. male with medical history of hypertension, class III obesity, OSA, iron deficiency anemia, who presented to the hospital with worsening shortness of breath after event URI symptoms.  Labs significant for WBC 19.2, proBNP 3831, troponin 134, creatinine 4.26 on admission.   CT chest IMPRESSION: 1. Mosaic ground-glass opacity in the dependent lung bases may be atelectasis or sequelae of small airways disease. 2. Hepatic steatosis.      Assessment/Plan:   Principal Problem:   Acute hypoxic respiratory failure (HCC) Active Problems:   Essential hypertension   Morbid obesity (HCC)   Iron deficiency anemia   Lymphedema   Prediabetes   OSA (obstructive sleep apnea)   Vitamin D  deficiency   AKI (acute kidney injury)   Hypokalemia   Body mass index is 68.87 kg/m.  (Class III obesity)   Abdominal pain, nausea and vomiting, ileus: Improved.  He is tolerating regular diet.  He moved his bowels.   Appreciate input from general surgeon   Acute hypoxic respiratory failure: Improved.  Oxygen saturation is normal on room air during the day.  However, he said that his oxygen drops at night.  He refused overnight oximetry test.  It was explained that he may need a overnight oximetry test to get approved for home oxygen. It was also explained that he may need outpatient sleep study before he can be eligible for CPAP at home.  He verbalized understanding but is reluctant to have overnight oximetry test. Consult TOC to help with home oxygen.   Acute heart failure with preserved EF: S/p treatment with IV Lasix . proBNP 3831. 2D echo showed EF estimated at 77 5%, grade 2 diastolic dysfunction.   AKI on CKD stage IIIb: Creatinine down from 6.42-5.75-5.59.   Continue to monitor. Creatinine had gone up from 4.68-5.43-6.42. Follow-up with nephrologist for further recommendations.   Hypokalemia: Potassium from 3.1-3.2-3.0.  Replete potassium and monitor levels.     Persistent leukocytosis: WBC up from 14.10-29-15.9.  Etiology unclear.  Continue to monitor.   Morbid obesity, suspected OSA: Continue CPAP at night in the hospital. Patient said that he believes he needs oxygen at night.  He said he had not done a formal sleep study before.  I strongly recommended outpatient follow-up for sleep study so that he can be fitted with a CPAP use at home.   Comorbidities include hypertension, microcytic anemia, chronic lymphedema of left lower extremity   Diet Order             Diet regular Room service appropriate? Yes; Fluid consistency: Thin  Diet effective now                                  Consultants: Nephrologist General Surgeon  Procedures: None    Medications:    amLODipine   10 mg Oral Daily   famotidine   20 mg Oral BID   gabapentin   100 mg Oral TID   heparin   5,000 Units Subcutaneous Q8H   potassium chloride   30 mEq Oral BID   simethicone   80 mg Oral QID   sodium chloride  flush  3 mL Intravenous Q12H   Continuous Infusions:  promethazine  (  PHENERGAN ) injection (IM or IVPB) 12.5 mg (03/09/24 1419)     Anti-infectives (From admission, onward)    Start     Dose/Rate Route Frequency Ordered Stop   03/10/24 0000  azithromycin  (ZITHROMAX ) tablet 500 mg        500 mg Oral Daily 03/09/24 1135 03/10/24 2352   03/07/24 0000  cefTRIAXone  (ROCEPHIN ) 2 g in sodium chloride  0.9 % 100 mL IVPB        2 g 200 mL/hr over 30 Minutes Intravenous Every 24 hours 03/06/24 2314 03/11/24 0145   03/07/24 0000  azithromycin  (ZITHROMAX ) 500 mg in sodium chloride  0.9 % 250 mL IVPB  Status:  Discontinued        500 mg 250 mL/hr over 60 Minutes Intravenous Every 24 hours 03/06/24 2314 03/09/24 1135               Family Communication/Anticipated D/C date and plan/Code Status   DVT prophylaxis: heparin  injection 5,000 Units Start: 03/06/24 1400     Code Status: Full Code  Family Communication: Plan discussed with his mother at the bedside Disposition Plan: Plan to discharge home   Status is: Inpatient Remains inpatient appropriate because: Worsening AKI       Subjective:   Interval events noted.  He has no complaints.  He refused overnight oximetry test. Marinda, RN, at the bedside  Objective:    Vitals:   03/13/24 2230 03/13/24 2337 03/14/24 0447 03/14/24 0803  BP:  136/85 (!) 156/101 (!) 138/92  Pulse: 99 (!) 105 85 95  Resp:  18 20 18   Temp:  98.3 F (36.8 C) 98.5 F (36.9 C) 98.2 F (36.8 C)  TempSrc:   Axillary   SpO2: (!) 89% 91% 92% 100%  Weight:      Height:       No data found.   Intake/Output Summary (Last 24 hours) at 03/14/2024 1122 Last data filed at 03/14/2024 0500 Gross per 24 hour  Intake 240 ml  Output 1350 ml  Net -1110 ml   Filed Weights   03/10/24 0500 03/11/24 0500 03/12/24 0518  Weight: (!) 174.6 kg (!) 174.2 kg (!) 230.3 kg    Exam:  GEN: NAD SKIN: Warm and dry EYES: No pallor or icterus ENT: MMM CV: RRR PULM: CTA B ABD: soft, grossly protuberant, NT, +BS CNS: AAO x 3, non focal EXT: Chronic left lower extremity edema       Data Reviewed:   I have personally reviewed following labs and imaging studies:  Labs: Labs show the following:   Basic Metabolic Panel: Recent Labs  Lab 03/07/24 1753 03/08/24 0338 03/10/24 0442 03/11/24 1000 03/12/24 0344 03/13/24 0340 03/14/24 0328  NA  --    < > 144 138 137 137 137  K  --    < > 3.6 3.5 3.1* 3.2* 3.0*  CL  --    < > 104 99 98 99 97*  CO2  --    < > 23 23 24 22 22   GLUCOSE  --    < > 103* 117* 94 115* 102*  BUN  --    < > 56* 64* 67* 75* 82*  CREATININE  --    < > 4.68* 5.43* 6.42* 5.75* 5.59*  CALCIUM   --    < > 9.9 9.4 9.9 9.3 9.2  MG 2.1  --   --   --    --   --  2.2  PHOS 3.7  --   --   --   --   --  6.2*   < > = values in this interval not displayed.   GFR Estimated Creatinine Clearance: 36.2 mL/min (A) (by C-G formula based on SCr of 5.59 mg/dL (H)). Liver Function Tests: Recent Labs  Lab 03/07/24 1753 03/14/24 0328  AST 38  --   ALT 23  --   ALKPHOS 72  --   BILITOT 0.4  --   PROT 7.6  --   ALBUMIN 3.2* 3.3*   No results for input(s): LIPASE, AMYLASE in the last 168 hours. No results for input(s): AMMONIA in the last 168 hours. Coagulation profile No results for input(s): INR, PROTIME in the last 168 hours.  CBC: Recent Labs  Lab 03/08/24 0338 03/09/24 0356 03/11/24 1000 03/13/24 0340 03/14/24 0328  WBC 11.9* 14.3* 14.8* 18.0* 17.9*  NEUTROABS 8.6* 11.6* 11.6*  --   --   HGB 8.9* 9.2* 8.7* 8.3* 8.4*  HCT 30.0* 30.9* 28.8* 26.7* 27.7*  MCV 76.1* 76.1* 75.2* 74.0* 75.7*  PLT 159 176 333 387 441*   Cardiac Enzymes: No results for input(s): CKTOTAL, CKMB, CKMBINDEX, TROPONINI in the last 168 hours. BNP (last 3 results) Recent Labs    03/06/24 0952  PROBNP 3,831.0*   CBG: Recent Labs  Lab 03/13/24 0807 03/13/24 1129 03/13/24 1704 03/13/24 2102 03/14/24 0802  GLUCAP 94 124* 103* 95 130*   D-Dimer: No results for input(s): DDIMER in the last 72 hours. Hgb A1c: No results for input(s): HGBA1C in the last 72 hours. Lipid Profile: No results for input(s): CHOL, HDL, LDLCALC, TRIG, CHOLHDL, LDLDIRECT in the last 72 hours. Thyroid  function studies: No results for input(s): TSH, T4TOTAL, T3FREE, THYROIDAB in the last 72 hours.  Invalid input(s): FREET3 Anemia work up: No results for input(s): VITAMINB12, FOLATE, FERRITIN, TIBC, IRON, RETICCTPCT in the last 72 hours. Sepsis Labs: Recent Labs  Lab 03/09/24 0356 03/11/24 1000 03/13/24 0340 03/14/24 0328  WBC 14.3* 14.8* 18.0* 17.9*    Microbiology Recent Results (from the past 240 hours)   Resp panel by RT-PCR (RSV, Flu A&B, Covid) Anterior Nasal Swab     Status: None   Collection Time: 03/06/24  9:26 AM   Specimen: Anterior Nasal Swab  Result Value Ref Range Status   SARS Coronavirus 2 by RT PCR NEGATIVE NEGATIVE Final    Comment: (NOTE) SARS-CoV-2 target nucleic acids are NOT DETECTED.  The SARS-CoV-2 RNA is generally detectable in upper respiratory specimens during the acute phase of infection. The lowest concentration of SARS-CoV-2 viral copies this assay can detect is 138 copies/mL. A negative result does not preclude SARS-Cov-2 infection and should not be used as the sole basis for treatment or other patient management decisions. A negative result may occur with  improper specimen collection/handling, submission of specimen other than nasopharyngeal swab, presence of viral mutation(s) within the areas targeted by this assay, and inadequate number of viral copies(<138 copies/mL). A negative result must be combined with clinical observations, patient history, and epidemiological information. The expected result is Negative.  Fact Sheet for Patients:  bloggercourse.com  Fact Sheet for Healthcare Providers:  seriousbroker.it  This test is no t yet approved or cleared by the [place]  FDA and  has been authorized for detection and/or diagnosis of SARS-CoV-2 by FDA under an Emergency Use Authorization (EUA). This EUA will remain  in effect (meaning this test can be used) for the duration of the COVID-19 declaration under Section 564(b)(1) of the Act, 21 U.S.C.section 360bbb-3(b)(1), unless the authorization is terminated  or revoked sooner.  Influenza A by PCR NEGATIVE NEGATIVE Final   Influenza B by PCR NEGATIVE NEGATIVE Final    Comment: (NOTE) The Xpert Xpress SARS-CoV-2/FLU/RSV plus assay is intended as an aid in the diagnosis of influenza from Nasopharyngeal swab specimens and should not be used  as a sole basis for treatment. Nasal washings and aspirates are unacceptable for Xpert Xpress SARS-CoV-2/FLU/RSV testing.  Fact Sheet for Patients: bloggercourse.com  Fact Sheet for Healthcare Providers: seriousbroker.it  This test is not yet approved or cleared by the [place]  FDA and has been authorized for detection and/or diagnosis of SARS-CoV-2 by FDA under an Emergency Use Authorization (EUA). This EUA will remain in effect (meaning this test can be used) for the duration of the COVID-19 declaration under Section 564(b)(1) of the Act, 21 U.S.C. section 360bbb-3(b)(1), unless the authorization is terminated or revoked.     Resp Syncytial Virus by PCR NEGATIVE NEGATIVE Final    Comment: (NOTE) Fact Sheet for Patients: bloggercourse.com  Fact Sheet for Healthcare Providers: seriousbroker.it  This test is not yet approved or cleared by the [place]  FDA and has been authorized for detection and/or diagnosis of SARS-CoV-2 by FDA under an Emergency Use Authorization (EUA). This EUA will remain in effect (meaning this test can be used) for the duration of the COVID-19 declaration under Section 564(b)(1) of the Act, 21 U.S.C. section 360bbb-3(b)(1), unless the authorization is terminated or revoked.  Performed at [facility], [address] [place], [place] [address]   Blood culture (routine x 2)     Status: None   Collection Time: 03/06/24  9:52 AM   Specimen: BLOOD  Result Value Ref Range Status   Specimen Description BLOOD BLOOD RIGHT ARM  Final   Special Requests   Final    BOTTLES DRAWN AEROBIC AND ANAEROBIC Blood Culture results may not be optimal due to an inadequate volume of blood received in culture bottles   Culture   Final    NO GROWTH 5 DAYS Performed at [facility], [address] [place], [place] [address]    Report Status  03/11/2024 FINAL  Final  Blood culture (routine x 2)     Status: None   Collection Time: 03/06/24  9:52 AM   Specimen: BLOOD  Result Value Ref Range Status   Specimen Description BLOOD BLOOD LEFT HAND  Final   Special Requests   Final    BOTTLES DRAWN AEROBIC AND ANAEROBIC Blood Culture adequate volume   Culture   Final    NO GROWTH 5 DAYS Performed at [facility], [address], [place], [place] [address]    Report Status 03/11/2024 FINAL  Final    Procedures and diagnostic studies:  No results found.              LOS: 8 days   Mearl Harewood  [facility]   Pager on [email or web address]. If 7PM-7AM, please contact night-coverage at [email or web address]     03/14/2024, 11:22 AM           "

## 2024-03-14 NOTE — Progress Notes (Signed)
 Physical Therapy Treatment Patient Details Name: Darrell Campbell MRN: [id number] DOB: 1990-08-09 Today's Date: 03/14/2024   History of Present Illness Darrell Campbell is a 33yoM who [facility] on 12/[facility] with 3 days URI symptoms, LLE edema. PMH: HTN, OSA, hypoferritinemia.  BNP: 3831, pt febrile, RVP negative.    PT Comments  Pt able to get to EOB with bed features but no physical assist today.  He continued to take inc time to motivate/prepare to move but is able to stand from bed with no outside assist and walk to door and back on 2 trials with long seated rest break and no AD.  He does have one LOB that he recovers on his own that he attributes to grip socks.  While walking, it is noted he has a very small pink open area on his buttock and RN is notified.  He remained up in chair with needs met.   If plan is discharge home, recommend the following: Help with stairs or ramp for entrance;A little help with walking and/or transfers;A little help with bathing/dressing/bathroom   Can travel by private vehicle        Equipment Recommendations       Recommendations for Other Services       Precautions / Restrictions Precautions Precautions: Fall Recall of Precautions/Restrictions: Intact Restrictions Weight Bearing Restrictions Per Provider Order: No     Mobility  Bed Mobility Overal bed mobility: Needs Assistance Bed Mobility: Supine to Sit     Supine to sit: Supervision, Used rails, HOB elevated     General bed mobility comments: inc time but able to do on his own today Patient Response: Cooperative  Transfers Overall transfer level: Needs assistance Equipment used: None Transfers: Sit to/from Stand Sit to Stand: Modified independent (Device/Increase time)                Ambulation/Gait Ambulation/Gait assistance: Supervision Gait Distance (Feet): 30 Feet Assistive device: None Gait Pattern/deviations: Step-through pattern, Decreased step length - left,  Decreased stance time - right, Wide base of support Gait velocity: decreased     General Gait Details: to door and back x 2 with no AD and extended rest break   Stairs             Wheelchair Mobility     Tilt Bed Tilt Bed Patient Response: Cooperative  Modified Rankin (Stroke Patients Only)       Balance Overall balance assessment: Mild deficits observed, not formally tested Sitting-balance support: Feet supported Sitting balance-Leahy Scale: Good     Standing balance support: No upper extremity supported Standing balance-Leahy Scale: Fair                              Hotel Manager: No apparent difficulties  Cognition Arousal: Alert Behavior During Therapy: WFL for tasks assessed/performed   PT - Cognitive impairments: No apparent impairments                         Following commands: Intact      Cueing Cueing Techniques: Verbal cues  Exercises      General Comments        Pertinent Vitals/Pain Pain Assessment Pain Assessment: Faces Faces Pain Scale: Hurts a little bit Pain Location: L foot Pain Descriptors / Indicators: Discomfort Pain Intervention(s): Limited activity within patient's tolerance, Monitored during session, Repositioned    Home Living  Prior Function            PT Goals (current goals can now be found in the care plan section) Progress towards PT goals: Progressing toward goals    Frequency    Min 2X/week      PT Plan      Co-evaluation              AM-PAC PT 6 Clicks Mobility   Outcome Measure  Help needed turning from your back to your side while in a flat bed without using bedrails?: A Little Help needed moving from lying on your back to sitting on the side of a flat bed without using bedrails?: A Little Help needed moving to and from a bed to a chair (including a wheelchair)?: None Help needed standing up from a  chair using your arms (e.g., wheelchair or bedside chair)?: None Help needed to walk in hospital room?: A Little Help needed climbing 3-5 steps with a railing? : A Lot 6 Click Score: 19    End of Session   Activity Tolerance: Patient tolerated treatment well Patient left: in chair;with call bell/phone within reach   PT Visit Diagnosis: Unsteadiness on feet (R26.81);Other abnormalities of gait and mobility (R26.89);Muscle weakness (generalized) (M62.81)     Time: 8944-8874 PT Time Calculation (min) (ACUTE ONLY): 30 min  Charges:    $Gait Training: 23-37 mins PT General Charges $$ ACUTE PT VISIT: 1 Visit                   Lauraine Gills, PTA 03/14/2024, 11:47 AM

## 2024-03-14 NOTE — Progress Notes (Signed)
 " [facility]  ROUNDING NOTE   Subjective:   Patient sitting up in chair Alert Room air Denies pain   Creatinine 5.59 (5.75) (6.42) (5.43) (4.68) (3.8)  Objective:  Vital signs in last 24 hours:  Temp:  [98 F (36.7 C)-98.5 F (36.9 C)] 98.1 F (36.7 C) (01/02 1141) Pulse Rate:  [79-105] 100 (01/02 1141) Resp:  [16-20] 16 (01/02 1141) BP: (115-156)/(85-101) 135/95 (01/02 1141) SpO2:  [89 %-100 %] 100 % (01/02 1141) FiO2 (%):  [28 %] 28 % (01/01 2230)  Weight change:  Filed Weights   03/10/24 0500 03/11/24 0500 03/12/24 0518  Weight: (!) 174.6 kg (!) 174.2 kg (!) 230.3 kg    Intake/Output: I/O last 3 completed shifts: In: 840 [P.O.:840] Out: 1350 [Urine:1350]   Intake/Output this shift:  No intake/output data recorded.  Physical Exam: General: Large body habitus  Head: Normocephalic  Eyes: Anicteric  Lungs:  Clear  Heart: Regular rate   Abdomen:  Soft, nontender, obese  Extremities:  +++ peripheral edema, L>R  Neurologic: alert  Skin: No lesions  Access: None    Basic Metabolic Panel: Recent Labs  Lab 03/07/24 1753 03/08/24 0338 03/10/24 0442 03/11/24 1000 03/12/24 0344 03/13/24 0340 03/14/24 0328  NA  --    < > 144 138 137 137 137  K  --    < > 3.6 3.5 3.1* 3.2* 3.0*  CL  --    < > 104 99 98 99 97*  CO2  --    < > 23 23 24 22 22   GLUCOSE  --    < > 103* 117* 94 115* 102*  BUN  --    < > 56* 64* 67* 75* 82*  CREATININE  --    < > 4.68* 5.43* 6.42* 5.75* 5.59*  CALCIUM   --    < > 9.9 9.4 9.9 9.3 9.2  MG 2.1  --   --   --   --   --  2.2  PHOS 3.7  --   --   --   --   --  6.2*   < > = values in this interval not displayed.    Liver Function Tests: Recent Labs  Lab 03/07/24 1753 03/14/24 0328  AST 38  --   ALT 23  --   ALKPHOS 72  --   BILITOT 0.4  --   PROT 7.6  --   ALBUMIN 3.2* 3.3*   No results for input(s): LIPASE, AMYLASE in the last 168 hours. No results for input(s): AMMONIA in the last 168  hours.  CBC: Recent Labs  Lab 03/08/24 0338 03/09/24 0356 03/11/24 1000 03/13/24 0340 03/14/24 0328  WBC 11.9* 14.3* 14.8* 18.0* 17.9*  NEUTROABS 8.6* 11.6* 11.6*  --   --   HGB 8.9* 9.2* 8.7* 8.3* 8.4*  HCT 30.0* 30.9* 28.8* 26.7* 27.7*  MCV 76.1* 76.1* 75.2* 74.0* 75.7*  PLT 159 176 333 387 441*    Cardiac Enzymes: No results for input(s): CKTOTAL, CKMB, CKMBINDEX, TROPONINI in the last 168 hours.  BNP: Invalid input(s): POCBNP  CBG: Recent Labs  Lab 03/13/24 1129 03/13/24 1704 03/13/24 2102 03/14/24 0802 03/14/24 1141  GLUCAP 124* 103* 95 130* 150*    Microbiology: Results for orders placed or performed during the hospital encounter of 03/06/24  Resp panel by RT-PCR (RSV, Flu A&B, Covid) Anterior Nasal Swab     Status: None   Collection Time: 03/06/24  9:26 AM   Specimen: Anterior Nasal Swab  Result Value Ref Range Status   SARS Coronavirus 2 by RT PCR NEGATIVE NEGATIVE Final    Comment: (NOTE) SARS-CoV-2 target nucleic acids are NOT DETECTED.  The SARS-CoV-2 RNA is generally detectable in upper respiratory specimens during the acute phase of infection. The lowest concentration of SARS-CoV-2 viral copies this assay can detect is 138 copies/mL. A negative result does not preclude SARS-Cov-2 infection and should not be used as the sole basis for treatment or other patient management decisions. A negative result may occur with  improper specimen collection/handling, submission of specimen other than nasopharyngeal swab, presence of viral mutation(s) within the areas targeted by this assay, and inadequate number of viral copies(<138 copies/mL). A negative result must be combined with clinical observations, patient history, and epidemiological information. The expected result is Negative.  Fact Sheet for Patients:  bloggercourse.com  Fact Sheet for Healthcare Providers:  seriousbroker.it  This  test is no t yet approved or cleared by the [place]  FDA and  has been authorized for detection and/or diagnosis of SARS-CoV-2 by FDA under an Emergency Use Authorization (EUA). This EUA will remain  in effect (meaning this test can be used) for the duration of the COVID-19 declaration under Section 564(b)(1) of the Act, 21 U.S.C.section 360bbb-3(b)(1), unless the authorization is terminated  or revoked sooner.       Influenza A by PCR NEGATIVE NEGATIVE Final   Influenza B by PCR NEGATIVE NEGATIVE Final    Comment: (NOTE) The Xpert Xpress SARS-CoV-2/FLU/RSV plus assay is intended as an aid in the diagnosis of influenza from Nasopharyngeal swab specimens and should not be used as a sole basis for treatment. Nasal washings and aspirates are unacceptable for Xpert Xpress SARS-CoV-2/FLU/RSV testing.  Fact Sheet for Patients: bloggercourse.com  Fact Sheet for Healthcare Providers: seriousbroker.it  This test is not yet approved or cleared by the [place]  FDA and has been authorized for detection and/or diagnosis of SARS-CoV-2 by FDA under an Emergency Use Authorization (EUA). This EUA will remain in effect (meaning this test can be used) for the duration of the COVID-19 declaration under Section 564(b)(1) of the Act, 21 U.S.C. section 360bbb-3(b)(1), unless the authorization is terminated or revoked.     Resp Syncytial Virus by PCR NEGATIVE NEGATIVE Final    Comment: (NOTE) Fact Sheet for Patients: bloggercourse.com  Fact Sheet for Healthcare Providers: seriousbroker.it  This test is not yet approved or cleared by the [place]  FDA and has been authorized for detection and/or diagnosis of SARS-CoV-2 by FDA under an Emergency Use Authorization (EUA). This EUA will remain in effect (meaning this test can be used) for the duration of the COVID-19 declaration under  Section 564(b)(1) of the Act, 21 U.S.C. section 360bbb-3(b)(1), unless the authorization is terminated or revoked.  Performed at [facility], [address] [place], [place] [address]   Blood culture (routine x 2)     Status: None   Collection Time: 03/06/24  9:52 AM   Specimen: BLOOD  Result Value Ref Range Status   Specimen Description BLOOD BLOOD RIGHT ARM  Final   Special Requests   Final    BOTTLES DRAWN AEROBIC AND ANAEROBIC Blood Culture results may not be optimal due to an inadequate volume of blood received in culture bottles   Culture   Final    NO GROWTH 5 DAYS Performed at [facility], [address], [place], [place] [address]    Report Status 03/11/2024 FINAL  Final  Blood culture (routine x 2)  Status: None   Collection Time: 03/06/24  9:52 AM   Specimen: BLOOD  Result Value Ref Range Status   Specimen Description BLOOD BLOOD LEFT HAND  Final   Special Requests   Final    BOTTLES DRAWN AEROBIC AND ANAEROBIC Blood Culture adequate volume   Culture   Final    NO GROWTH 5 DAYS Performed at [facility], [address] [place], [place] [address]    Report Status 03/11/2024 FINAL  Final    Coagulation Studies: No results for input(s): LABPROT, INR in the last 72 hours.  Urinalysis: No results for input(s): COLORURINE, LABSPEC, PHURINE, GLUCOSEU, HGBUR, BILIRUBINUR, KETONESUR, PROTEINUR, UROBILINOGEN, NITRITE, LEUKOCYTESUR in the last 72 hours.  Invalid input(s): APPERANCEUR     Imaging: No results found.    Medications:    promethazine  (PHENERGAN ) injection (IM or IVPB) 12.5 mg (03/09/24 1419)    amLODipine   10 mg Oral Daily   famotidine   20 mg Oral BID   gabapentin   100 mg Oral TID   heparin   5,000 Units Subcutaneous Q8H   potassium chloride   30 mEq Oral BID   simethicone   80 mg Oral QID   sodium chloride  flush  3 mL Intravenous Q12H   acetaminophen  **OR** acetaminophen , alum & mag  hydroxide-simeth, ipratropium-albuterol , labetalol , lidocaine , methocarbamol  (ROBAXIN ) injection, ondansetron  **OR** ondansetron  (ZOFRAN ) IV, promethazine  (PHENERGAN ) injection (IM or IVPB), senna-docusate  Assessment/ Plan:  Mr. Darrell Campbell is a 34 y.o.  male  with hypertension, sleep apnea, obesity and anemia, who was admitted to [facility] on 03/06/2024 for Paroxysmal atrial fibrillation (HCC) [I48.0] SOB (shortness of breath) [R06.02] Upper respiratory tract infection, unspecified type [J06.9] Congestive heart failure, unspecified HF chronicity, unspecified heart failure type (HCC) [I50.9] Acute hypoxic respiratory failure (HCC) [J96.01]    Acute Kidney Injury on chronic kidney disease stage IIIB with proteinuria: baseline creatinine 2.1, GFR of 41. Renal ultrasound shows no obstruction. No recent IV contrast exposure. Concern for progression of chronic kidney disease. Losartan  held.  Creatinine continues to improve with decent urine output, 1.3L No acute indication for dialysis. Continue to encourage oral intake   Lab Results  Component Value Date   CREATININE 5.59 (H) 03/14/2024   CREATININE 5.75 (H) 03/13/2024   CREATININE 6.42 (H) 03/12/2024     Intake/Output Summary (Last 24 hours) at 03/14/2024 1536 Last data filed at 03/14/2024 0500 Gross per 24 hour  Intake 240 ml  Output 1350 ml  Net -1110 ml      Hypertension with chronic kidney disease: with acute exacerbation of chronic diastolic congestive heart failure.  Home regimen of losartan , hydrochlorothiazide , and amlodipine . However unclear of his compliance.  IV diuretics as needed Amlodipine     Anemia on chronic kidney disease: with iron deficiency.  - oral iron supplements - do not recommend IV iron with active infection - Hgb 8.4    Latest Reference Range & Units 03/09/24 03:56  HgB 13.0 - 17.0 g/dL 9.2 (L)  HCT 60.9 - 47.9 % 30.9 (L)  MCV 80.0 - 100.0 fL 76.1 (L)  MCH 26.0 - 34.0 pg 22.7 (L)  MCHC 30.0 - 36.0 g/dL  70.1 (L)  RDW 88.4 - 84.4 % 17.7 (H)    Hypokalemia: on hydrochlorothiazide  as outpatient. Magnesium  level replaced.  - Potassium 3.0. Oral supplementation ordered by primary team         LOS: 8 Darrell Campbell 1/2/20263:36 PM  "

## 2024-03-14 NOTE — Plan of Care (Signed)

## 2024-03-15 LAB — CBC
HCT: 27.1 % — ABNORMAL LOW (ref 39.0–52.0)
Hemoglobin: 8.4 g/dL — ABNORMAL LOW (ref 13.0–17.0)
MCH: 23.1 pg — ABNORMAL LOW (ref 26.0–34.0)
MCHC: 31 g/dL (ref 30.0–36.0)
MCV: 74.7 fL — ABNORMAL LOW (ref 80.0–100.0)
Platelets: 419 K/uL — ABNORMAL HIGH (ref 150–400)
RBC: 3.63 MIL/uL — ABNORMAL LOW (ref 4.22–5.81)
RDW: 18.3 % — ABNORMAL HIGH (ref 11.5–15.5)
WBC: 15.8 K/uL — ABNORMAL HIGH (ref 4.0–10.5)
nRBC: 0 % (ref 0.0–0.2)

## 2024-03-15 LAB — RENAL FUNCTION PANEL
Albumin: 3.4 g/dL — ABNORMAL LOW (ref 3.5–5.0)
Anion gap: 16 — ABNORMAL HIGH (ref 5–15)
BUN: 83 mg/dL — ABNORMAL HIGH (ref 6–20)
CO2: 21 mmol/L — ABNORMAL LOW (ref 22–32)
Calcium: 9.6 mg/dL (ref 8.9–10.3)
Chloride: 98 mmol/L (ref 98–111)
Creatinine, Ser: 5.01 mg/dL — ABNORMAL HIGH (ref 0.61–1.24)
GFR, Estimated: 15 mL/min — ABNORMAL LOW
Glucose, Bld: 111 mg/dL — ABNORMAL HIGH (ref 70–99)
Phosphorus: 6 mg/dL — ABNORMAL HIGH (ref 2.5–4.6)
Potassium: 3.2 mmol/L — ABNORMAL LOW (ref 3.5–5.1)
Sodium: 135 mmol/L (ref 135–145)

## 2024-03-15 LAB — GLUCOSE, CAPILLARY
Glucose-Capillary: 106 mg/dL — ABNORMAL HIGH (ref 70–99)
Glucose-Capillary: 142 mg/dL — ABNORMAL HIGH (ref 70–99)
Glucose-Capillary: 109 mg/dL — ABNORMAL HIGH (ref 70–99)
Glucose-Capillary: 102 mg/dL — ABNORMAL HIGH (ref 70–99)

## 2024-03-15 MED ORDER — POTASSIUM CHLORIDE CRYS ER 20 MEQ PO TBCR
30.0000 meq | EXTENDED_RELEASE_TABLET | Freq: Two times a day (BID) | ORAL | Status: AC
  Administered 2024-03-15 (×2): 30 meq via ORAL
  Filled 2024-03-15 (×2): qty 1

## 2024-03-15 NOTE — Progress Notes (Signed)
 " [facility]  ROUNDING NOTE   Subjective:   Patient seen resting quietly in bed BiPAP remains in place  Creatinine 5.01 (5.59) (5.75) (6.42) (5.43) (4.68) (3.8)  Objective:  Vital signs in last 24 hours:  Temp:  [97.8 F (36.6 C)-98.2 F (36.8 C)] 97.8 F (36.6 C) (01/03 0818) Pulse Rate:  [75-108] 82 (01/03 0818) Resp:  [16-20] 20 (01/03 0818) BP: (131-152)/(87-104) 145/94 (01/03 0818) SpO2:  [92 %-100 %] 92 % (01/03 0818)  Weight change:  Filed Weights   03/10/24 0500 03/11/24 0500 03/12/24 0518  Weight: (!) 174.6 kg (!) 174.2 kg (!) 230.3 kg    Intake/Output: I/O last 3 completed shifts: In: -  Out: 2950 [Urine:2950]   Intake/Output this shift:  Total I/O In: -  Out: 600 [Urine:600]  Physical Exam: General: Large body habitus  Head: Normocephalic  Eyes: Anicteric  Lungs:  BiPAP  Heart: Regular rate   Abdomen:  Soft, nontender, obese  Extremities:  +++ peripheral edema, L>R  Neurologic:  drowsy  Skin: No lesions  Access: None    Basic Metabolic Panel: Recent Labs  Lab 03/11/24 1000 03/12/24 0344 03/13/24 0340 03/14/24 0328 03/15/24 0453  NA 138 137 137 137 135  K 3.5 3.1* 3.2* 3.0* 3.2*  CL 99 98 99 97* 98  CO2 23 24 22 22  21*  GLUCOSE 117* 94 115* 102* 111*  BUN 64* 67* 75* 82* 83*  CREATININE 5.43* 6.42* 5.75* 5.59* 5.01*  CALCIUM  9.4 9.9 9.3 9.2 9.6  MG  --   --   --  2.2  --   PHOS  --   --   --  6.2* 6.0*    Liver Function Tests: Recent Labs  Lab 03/14/24 0328 03/15/24 0453  ALBUMIN 3.3* 3.4*   No results for input(s): LIPASE, AMYLASE in the last 168 hours. No results for input(s): AMMONIA in the last 168 hours.  CBC: Recent Labs  Lab 03/09/24 0356 03/11/24 1000 03/13/24 0340 03/14/24 0328 03/15/24 0453  WBC 14.3* 14.8* 18.0* 17.9* 15.8*  NEUTROABS 11.6* 11.6*  --   --   --   HGB 9.2* 8.7* 8.3* 8.4* 8.4*  HCT 30.9* 28.8* 26.7* 27.7* 27.1*  MCV 76.1* 75.2* 74.0* 75.7* 74.7*  PLT 176 333 387 441* 419*     Cardiac Enzymes: No results for input(s): CKTOTAL, CKMB, CKMBINDEX, TROPONINI in the last 168 hours.  BNP: Invalid input(s): POCBNP  CBG: Recent Labs  Lab 03/14/24 0802 03/14/24 1141 03/14/24 1636 03/14/24 1957 03/15/24 0813  GLUCAP 130* 150* 99 106* 106*    Microbiology: Results for orders placed or performed during the hospital encounter of 03/06/24  Resp panel by RT-PCR (RSV, Flu A&B, Covid) Anterior Nasal Swab     Status: None   Collection Time: 03/06/24  9:26 AM   Specimen: Anterior Nasal Swab  Result Value Ref Range Status   SARS Coronavirus 2 by RT PCR NEGATIVE NEGATIVE Final    Comment: (NOTE) SARS-CoV-2 target nucleic acids are NOT DETECTED.  The SARS-CoV-2 RNA is generally detectable in upper respiratory specimens during the acute phase of infection. The lowest concentration of SARS-CoV-2 viral copies this assay can detect is 138 copies/mL. A negative result does not preclude SARS-Cov-2 infection and should not be used as the sole basis for treatment or other patient management decisions. A negative result may occur with  improper specimen collection/handling, submission of specimen other than nasopharyngeal swab, presence of viral mutation(s) within the areas targeted by this assay, and  inadequate number of viral copies(<138 copies/mL). A negative result must be combined with clinical observations, patient history, and epidemiological information. The expected result is Negative.  Fact Sheet for Patients:  bloggercourse.com  Fact Sheet for Healthcare Providers:  seriousbroker.it  This test is no t yet approved or cleared by the [place]  FDA and  has been authorized for detection and/or diagnosis of SARS-CoV-2 by FDA under an Emergency Use Authorization (EUA). This EUA will remain  in effect (meaning this test can be used) for the duration of the COVID-19 declaration under Section  564(b)(1) of the Act, 21 U.S.C.section 360bbb-3(b)(1), unless the authorization is terminated  or revoked sooner.       Influenza A by PCR NEGATIVE NEGATIVE Final   Influenza B by PCR NEGATIVE NEGATIVE Final    Comment: (NOTE) The Xpert Xpress SARS-CoV-2/FLU/RSV plus assay is intended as an aid in the diagnosis of influenza from Nasopharyngeal swab specimens and should not be used as a sole basis for treatment. Nasal washings and aspirates are unacceptable for Xpert Xpress SARS-CoV-2/FLU/RSV testing.  Fact Sheet for Patients: bloggercourse.com  Fact Sheet for Healthcare Providers: seriousbroker.it  This test is not yet approved or cleared by the [place]  FDA and has been authorized for detection and/or diagnosis of SARS-CoV-2 by FDA under an Emergency Use Authorization (EUA). This EUA will remain in effect (meaning this test can be used) for the duration of the COVID-19 declaration under Section 564(b)(1) of the Act, 21 U.S.C. section 360bbb-3(b)(1), unless the authorization is terminated or revoked.     Resp Syncytial Virus by PCR NEGATIVE NEGATIVE Final    Comment: (NOTE) Fact Sheet for Patients: bloggercourse.com  Fact Sheet for Healthcare Providers: seriousbroker.it  This test is not yet approved or cleared by the [place]  FDA and has been authorized for detection and/or diagnosis of SARS-CoV-2 by FDA under an Emergency Use Authorization (EUA). This EUA will remain in effect (meaning this test can be used) for the duration of the COVID-19 declaration under Section 564(b)(1) of the Act, 21 U.S.C. section 360bbb-3(b)(1), unless the authorization is terminated or revoked.  Performed at [facility], [address] [place], [place] [address]   Blood culture (routine x 2)     Status: None   Collection Time: 03/06/24  9:52 AM   Specimen: BLOOD   Result Value Ref Range Status   Specimen Description BLOOD BLOOD RIGHT ARM  Final   Special Requests   Final    BOTTLES DRAWN AEROBIC AND ANAEROBIC Blood Culture results may not be optimal due to an inadequate volume of blood received in culture bottles   Culture   Final    NO GROWTH 5 DAYS Performed at [facility] [address] [place], [place] [address]    Report Status 03/11/2024 FINAL  Final  Blood culture (routine x 2)     Status: None   Collection Time: 03/06/24  9:52 AM   Specimen: BLOOD  Result Value Ref Range Status   Specimen Description BLOOD BLOOD LEFT HAND  Final   Special Requests   Final    BOTTLES DRAWN AEROBIC AND ANAEROBIC Blood Culture adequate volume   Culture   Final    NO GROWTH 5 DAYS Performed at [facility], [address], [place], [place] [address]    Report Status 03/11/2024 FINAL  Final    Coagulation Studies: No results for input(s): LABPROT, INR in the last 72 hours.  Urinalysis: No results for input(s): COLORURINE, LABSPEC, PHURINE,  GLUCOSEU, HGBUR, BILIRUBINUR, KETONESUR, PROTEINUR, UROBILINOGEN, NITRITE, LEUKOCYTESUR in the last 72 hours.  Invalid input(s): APPERANCEUR     Imaging: No results found.    Medications:    promethazine  (PHENERGAN ) injection (IM or IVPB) 12.5 mg (03/09/24 1419)    amLODipine   10 mg Oral Daily   famotidine   20 mg Oral BID   gabapentin   100 mg Oral TID   heparin   5,000 Units Subcutaneous Q8H   potassium chloride   30 mEq Oral BID   simethicone   80 mg Oral QID   sodium chloride  flush  3 mL Intravenous Q12H   acetaminophen  **OR** acetaminophen , alum & mag hydroxide-simeth, ipratropium-albuterol , labetalol , lidocaine , methocarbamol  (ROBAXIN ) injection, ondansetron  **OR** ondansetron  (ZOFRAN ) IV, promethazine  (PHENERGAN ) injection (IM or IVPB), senna-docusate  Assessment/ Plan:  Mr. Darrell Campbell is a 34 y.o.  male  with hypertension, sleep apnea,  obesity and anemia, who was admitted to [facility] on 03/06/2024 for Paroxysmal atrial fibrillation (HCC) [I48.0] SOB (shortness of breath) [R06.02] Upper respiratory tract infection, unspecified type [J06.9] Congestive heart failure, unspecified HF chronicity, unspecified heart failure type (HCC) [I50.9] Acute hypoxic respiratory failure (HCC) [J96.01]    Acute Kidney Injury on chronic kidney disease stage IIIB with proteinuria: baseline creatinine 2.1, GFR of 41. Renal ultrasound shows no obstruction. No recent IV contrast exposure. Concern for progression of chronic kidney disease. Losartan  held.  Renal function improving, decent urine output.  1.9 L.  Will continue to monitor renal recovery.  No acute indication for dialysis.  Continue to avoid nephrotoxic agents and therapies along with hypotension at this time.   Lab Results  Component Value Date   CREATININE 5.01 (H) 03/15/2024   CREATININE 5.59 (H) 03/14/2024   CREATININE 5.75 (H) 03/13/2024     Intake/Output Summary (Last 24 hours) at 03/15/2024 1147 Last data filed at 03/15/2024 0900 Gross per 24 hour  Intake --  Output 2500 ml  Net -2500 ml      Hypertension with chronic kidney disease: with acute exacerbation of chronic diastolic congestive heart failure.  Home regimen of losartan , hydrochlorothiazide , and amlodipine . However unclear of his compliance.  IV diuretics as needed Continue amlodipine     Anemia on chronic kidney disease: with iron deficiency.  - oral iron supplements - do not recommend IV iron with active infection - Hgb stable    Latest Reference Range & Units 03/09/24 03:56  HgB 13.0 - 17.0 g/dL 9.2 (L)  HCT 60.9 - 47.9 % 30.9 (L)  MCV 80.0 - 100.0 fL 76.1 (L)  MCH 26.0 - 34.0 pg 22.7 (L)  MCHC 30.0 - 36.0 g/dL 70.1 (L)  RDW 88.4 - 84.4 % 17.7 (H)    Hypokalemia: on hydrochlorothiazide  as outpatient. Magnesium  level replaced.  - Potassium 3.2. Oral supplementation ordered by primary team         LOS:  9 Anila Bojarski 1/3/202611:47 AM  "

## 2024-03-15 NOTE — Plan of Care (Signed)
 Pt unable to complete pulse oximetry study, Respiratory Therapist needs new order. Pt wore CPAP through the night. Ambulated to bedside commode. Purewick changed. Pt c/o indigestion, PRN maalox given per order.   Problem: Education: Goal: Knowledge of General Education information will improve Description: Including pain rating scale, medication(s)/side effects and non-pharmacologic comfort measures Outcome: Progressing   Problem: Health Behavior/Discharge Planning: Goal: Ability to manage health-related needs will improve Outcome: Progressing   Problem: Clinical Measurements: Goal: Ability to maintain clinical measurements within normal limits will improve Outcome: Progressing Goal: Will remain free from infection Outcome: Progressing Goal: Diagnostic test results will improve Outcome: Progressing Goal: Respiratory complications will improve Outcome: Progressing Goal: Cardiovascular complication will be avoided Outcome: Progressing   Problem: Activity: Goal: Risk for activity intolerance will decrease Outcome: Progressing   Problem: Nutrition: Goal: Adequate nutrition will be maintained Outcome: Progressing   Problem: Coping: Goal: Level of anxiety will decrease Outcome: Progressing   Problem: Elimination: Goal: Will not experience complications related to bowel motility Outcome: Progressing Goal: Will not experience complications related to urinary retention Outcome: Progressing   Problem: Pain Managment: Goal: General experience of comfort will improve and/or be controlled Outcome: Progressing   Problem: Safety: Goal: Ability to remain free from injury will improve Outcome: Progressing   Problem: Skin Integrity: Goal: Risk for impaired skin integrity will decrease Outcome: Progressing

## 2024-03-15 NOTE — Progress Notes (Signed)
 Mobility Specialist Progress Note:    03/15/24 1529  Mobility  Activity Ambulated with assistance;Pivoted/transferred to/from [facility]  Level of Assistance Contact guard assist, steadying assist  Assistive Device None  Distance Ambulated (ft) 22 ft  Range of Motion/Exercises Active;All extremities  Activity Response Tolerated well  Mobility visit 1 Mobility  Mobility Specialist Start Time (ACUTE ONLY) 1514  Mobility Specialist Stop Time (ACUTE ONLY) 1529  Mobility Specialist Time Calculation (min) (ACUTE ONLY) 15 min   Pt received in chair, agreeable to mobility. Required CGA to stand and ambulate with RW. Tolerated well, audible SOB during session. SpO2 94% on RA. Left pt in chair,  All needs met.   Sherrilee Ditty Mobility Specialist Please contact via Special Educational Needs Teacher or  Rehab office at [phone]

## 2024-03-15 NOTE — Plan of Care (Signed)

## 2024-03-15 NOTE — Progress Notes (Signed)
 "    Progress Note    Darrell HATEM CULL  FMW:[id number] DOB: 1990/11/25  DOA: 03/06/2024 PCP: Osker Tinnie HERO, FNP      Brief Narrative:    Medical records reviewed and are as summarized below:  Darrell Campbell is a 34 y.o. male with medical history of hypertension, class III obesity, OSA, iron deficiency anemia, who presented to the hospital with worsening shortness of breath after event URI symptoms.  Labs significant for WBC 19.2, proBNP 3831, troponin 134, creatinine 4.26 on admission.   CT chest IMPRESSION: 1. Mosaic ground-glass opacity in the dependent lung bases may be atelectasis or sequelae of small airways disease. 2. Hepatic steatosis.      Assessment/Plan:   Principal Problem:   Acute hypoxic respiratory failure (HCC) Active Problems:   Essential hypertension   Morbid obesity (HCC)   Iron deficiency anemia   Lymphedema   Prediabetes   OSA (obstructive sleep apnea)   Vitamin D  deficiency   AKI (acute kidney injury)   Hypokalemia   Body mass index is 68.87 kg/m.  (Class III obesity)   Abdominal pain, nausea and vomiting, ileus: Improved.  He is tolerating regular diet.  He moved his bowels.   Appreciate input from general surgeon   Acute hypoxic respiratory failure: Improved.  Oxygen saturation is normal on room air during the day.  However, he said that his oxygen drops at night.  He refused overnight oximetry test.  It was explained that he may need a overnight oximetry test to get approved for home oxygen. It was also explained that he may need outpatient sleep study before he can be eligible for CPAP at home.  He verbalized understanding but is reluctant to have overnight oximetry test. He continues to decline overnight oximetry test.   Acute heart failure with preserved EF: S/p treatment with IV Lasix . proBNP 3831. 2D echo showed EF estimated at 77 5%, grade 2 diastolic dysfunction.   AKI on CKD stage IIIb: Creatinine down from  6.42-5.75-5.59-5.01.  Continue to monitor. Creatinine had gone up from 4.68-5.43-6.42. Follow-up with nephrologist for further recommendations.   Hypokalemia: Potassium from 3.1-3.2-3.0-3.2.  Continue potassium repletion..     Persistent leukocytosis: WBC up from 14.10-29-15.9-15.8.  Leukocytosis is slowly improving.  Etiology unclear.  Continue to monitor.   Morbid obesity, suspected OSA: Continue CPAP at night in the hospital. Patient said that he believes he needs oxygen at night.  He said he had not done a formal sleep study before.  I strongly recommended outpatient follow-up for sleep study so that he can be fitted with a CPAP for use at home.   Comorbidities include hypertension, microcytic anemia, chronic lymphedema of left lower extremity   Possible discharge home in 1 to 2 days   Diet Order             Diet regular Room service appropriate? Yes; Fluid consistency: Thin  Diet effective now                                  Consultants: Nephrologist General Surgeon  Procedures: None    Medications:    amLODipine   10 mg Oral Daily   famotidine   20 mg Oral BID   gabapentin   100 mg Oral TID   heparin   5,000 Units Subcutaneous Q8H   potassium chloride   30 mEq Oral BID   simethicone   80 mg Oral QID  sodium chloride  flush  3 mL Intravenous Q12H   Continuous Infusions:  promethazine  (PHENERGAN ) injection (IM or IVPB) 12.5 mg (03/09/24 1419)     Anti-infectives (From admission, onward)    Start     Dose/Rate Route Frequency Ordered Stop   03/10/24 0000  azithromycin  (ZITHROMAX ) tablet 500 mg        500 mg Oral Daily 03/09/24 1135 03/10/24 2352   03/07/24 0000  cefTRIAXone  (ROCEPHIN ) 2 g in sodium chloride  0.9 % 100 mL IVPB        2 g 200 mL/hr over 30 Minutes Intravenous Every 24 hours 03/06/24 2314 03/11/24 0145   03/07/24 0000  azithromycin  (ZITHROMAX ) 500 mg in sodium chloride  0.9 % 250 mL IVPB  Status:  Discontinued        500  mg 250 mL/hr over 60 Minutes Intravenous Every 24 hours 03/06/24 2314 03/09/24 1135              Family Communication/Anticipated D/C date and plan/Code Status   DVT prophylaxis: heparin  injection 5,000 Units Start: 03/06/24 1400     Code Status: Full Code  Family Communication: Plan discussed with his mother at the bedside Disposition Plan: Plan to discharge home   Status is: Inpatient Remains inpatient appropriate because: AKI       Subjective:   Interval events noted.  No complaints.  He is moving his bowels regularly.  No abdominal pain or vomiting.  He has adequate urine output.  Objective:    Vitals:   03/14/24 2113 03/14/24 2338 03/15/24 0818 03/15/24 1313  BP:  (!) 133/97 (!) 145/94 (!) 123/95  Pulse:  78 82 62  Resp:  20 20 18   Temp:  97.9 F (36.6 C) 97.8 F (36.6 C) 98.1 F (36.7 C)  TempSrc:  Axillary Axillary   SpO2: 97%  92% 92%  Weight:      Height:       No data found.   Intake/Output Summary (Last 24 hours) at 03/15/2024 1453 Last data filed at 03/15/2024 0900 Gross per 24 hour  Intake --  Output 2500 ml  Net -2500 ml   Filed Weights   03/10/24 0500 03/11/24 0500 03/12/24 0518  Weight: (!) 174.6 kg (!) 174.2 kg (!) 230.3 kg    Exam:  GEN: NAD SKIN: Warm and dry EYES: No pallor or icterus ENT: MMM CV: RRR PULM: CTA B ABD: soft, protuberant, NT, +BS CNS: AAO x 3, non focal EXT: Chronic left leg lymphedema      Data Reviewed:   I have personally reviewed following labs and imaging studies:  Labs: Labs show the following:   Basic Metabolic Panel: Recent Labs  Lab 03/11/24 1000 03/12/24 0344 03/13/24 0340 03/14/24 0328 03/15/24 0453  NA 138 137 137 137 135  K 3.5 3.1* 3.2* 3.0* 3.2*  CL 99 98 99 97* 98  CO2 23 24 22 22  21*  GLUCOSE 117* 94 115* 102* 111*  BUN 64* 67* 75* 82* 83*  CREATININE 5.43* 6.42* 5.75* 5.59* 5.01*  CALCIUM  9.4 9.9 9.3 9.2 9.6  MG  --   --   --  2.2  --   PHOS  --   --   --  6.2*  6.0*   GFR Estimated Creatinine Clearance: 40.4 mL/min (A) (by C-G formula based on SCr of 5.01 mg/dL (H)). Liver Function Tests: Recent Labs  Lab 03/14/24 0328 03/15/24 0453  ALBUMIN 3.3* 3.4*   No results for input(s): LIPASE, AMYLASE in the last 168  hours. No results for input(s): AMMONIA in the last 168 hours. Coagulation profile No results for input(s): INR, PROTIME in the last 168 hours.  CBC: Recent Labs  Lab 03/09/24 0356 03/11/24 1000 03/13/24 0340 03/14/24 0328 03/15/24 0453  WBC 14.3* 14.8* 18.0* 17.9* 15.8*  NEUTROABS 11.6* 11.6*  --   --   --   HGB 9.2* 8.7* 8.3* 8.4* 8.4*  HCT 30.9* 28.8* 26.7* 27.7* 27.1*  MCV 76.1* 75.2* 74.0* 75.7* 74.7*  PLT 176 333 387 441* 419*   Cardiac Enzymes: No results for input(s): CKTOTAL, CKMB, CKMBINDEX, TROPONINI in the last 168 hours. BNP (last 3 results) Recent Labs    03/06/24 0952  PROBNP 3,831.0*   CBG: Recent Labs  Lab 03/14/24 1141 03/14/24 1636 03/14/24 1957 03/15/24 0813 03/15/24 1206  GLUCAP 150* 99 106* 106* 142*   D-Dimer: No results for input(s): DDIMER in the last 72 hours. Hgb A1c: No results for input(s): HGBA1C in the last 72 hours. Lipid Profile: No results for input(s): CHOL, HDL, LDLCALC, TRIG, CHOLHDL, LDLDIRECT in the last 72 hours. Thyroid  function studies: No results for input(s): TSH, T4TOTAL, T3FREE, THYROIDAB in the last 72 hours.  Invalid input(s): FREET3 Anemia work up: No results for input(s): VITAMINB12, FOLATE, FERRITIN, TIBC, IRON, RETICCTPCT in the last 72 hours. Sepsis Labs: Recent Labs  Lab 03/11/24 1000 03/13/24 0340 03/14/24 0328 03/15/24 0453  WBC 14.8* 18.0* 17.9* 15.8*    Microbiology Recent Results (from the past 240 hours)  Resp panel by RT-PCR (RSV, Flu A&B, Covid) Anterior Nasal Swab     Status: None   Collection Time: 03/06/24  9:26 AM   Specimen: Anterior Nasal Swab  Result Value Ref Range  Status   SARS Coronavirus 2 by RT PCR NEGATIVE NEGATIVE Final    Comment: (NOTE) SARS-CoV-2 target nucleic acids are NOT DETECTED.  The SARS-CoV-2 RNA is generally detectable in upper respiratory specimens during the acute phase of infection. The lowest concentration of SARS-CoV-2 viral copies this assay can detect is 138 copies/mL. A negative result does not preclude SARS-Cov-2 infection and should not be used as the sole basis for treatment or other patient management decisions. A negative result may occur with  improper specimen collection/handling, submission of specimen other than nasopharyngeal swab, presence of viral mutation(s) within the areas targeted by this assay, and inadequate number of viral copies(<138 copies/mL). A negative result must be combined with clinical observations, patient history, and epidemiological information. The expected result is Negative.  Fact Sheet for Patients:  bloggercourse.com  Fact Sheet for Healthcare Providers:  seriousbroker.it  This test is no t yet approved or cleared by the [place]  FDA and  has been authorized for detection and/or diagnosis of SARS-CoV-2 by FDA under an Emergency Use Authorization (EUA). This EUA will remain  in effect (meaning this test can be used) for the duration of the COVID-19 declaration under Section 564(b)(1) of the Act, 21 U.S.C.section 360bbb-3(b)(1), unless the authorization is terminated  or revoked sooner.       Influenza A by PCR NEGATIVE NEGATIVE Final   Influenza B by PCR NEGATIVE NEGATIVE Final    Comment: (NOTE) The Xpert Xpress SARS-CoV-2/FLU/RSV plus assay is intended as an aid in the diagnosis of influenza from Nasopharyngeal swab specimens and should not be used as a sole basis for treatment. Nasal washings and aspirates are unacceptable for Xpert Xpress SARS-CoV-2/FLU/RSV testing.  Fact Sheet for  Patients: bloggercourse.com  Fact Sheet for Healthcare Providers: seriousbroker.it  This test is not  yet approved or cleared by the [place]  FDA and has been authorized for detection and/or diagnosis of SARS-CoV-2 by FDA under an Emergency Use Authorization (EUA). This EUA will remain in effect (meaning this test can be used) for the duration of the COVID-19 declaration under Section 564(b)(1) of the Act, 21 U.S.C. section 360bbb-3(b)(1), unless the authorization is terminated or revoked.     Resp Syncytial Virus by PCR NEGATIVE NEGATIVE Final    Comment: (NOTE) Fact Sheet for Patients: bloggercourse.com  Fact Sheet for Healthcare Providers: seriousbroker.it  This test is not yet approved or cleared by the [place]  FDA and has been authorized for detection and/or diagnosis of SARS-CoV-2 by FDA under an Emergency Use Authorization (EUA). This EUA will remain in effect (meaning this test can be used) for the duration of the COVID-19 declaration under Section 564(b)(1) of the Act, 21 U.S.C. section 360bbb-3(b)(1), unless the authorization is terminated or revoked.  Performed at [facility], [address] [place], [place] [address]   Blood culture (routine x 2)     Status: None   Collection Time: 03/06/24  9:52 AM   Specimen: BLOOD  Result Value Ref Range Status   Specimen Description BLOOD BLOOD RIGHT ARM  Final   Special Requests   Final    BOTTLES DRAWN AEROBIC AND ANAEROBIC Blood Culture results may not be optimal due to an inadequate volume of blood received in culture bottles   Culture   Final    NO GROWTH 5 DAYS Performed at [facility], [address], [place], [place] [address]    Report Status 03/11/2024 FINAL  Final  Blood culture (routine x 2)     Status: None   Collection Time: 03/06/24  9:52 AM   Specimen: BLOOD  Result Value  Ref Range Status   Specimen Description BLOOD BLOOD LEFT HAND  Final   Special Requests   Final    BOTTLES DRAWN AEROBIC AND ANAEROBIC Blood Culture adequate volume   Culture   Final    NO GROWTH 5 DAYS Performed at [facility], [address], [place], [place] [address]    Report Status 03/11/2024 FINAL  Final    Procedures and diagnostic studies:  No results found.              LOS: 9 days   Aloise Copus  [employer] on [email or web address]. If 7PM-7AM, please contact night-coverage at [email or web address]     03/15/2024, 2:53 PM           "

## 2024-03-16 ENCOUNTER — Other Ambulatory Visit: Payer: Self-pay

## 2024-03-16 LAB — CBC
HCT: 25.7 % — ABNORMAL LOW (ref 39.0–52.0)
Hemoglobin: 8 g/dL — ABNORMAL LOW (ref 13.0–17.0)
MCH: 23.3 pg — ABNORMAL LOW (ref 26.0–34.0)
MCHC: 31.1 g/dL (ref 30.0–36.0)
MCV: 74.7 fL — ABNORMAL LOW (ref 80.0–100.0)
Platelets: 402 K/uL — ABNORMAL HIGH (ref 150–400)
RBC: 3.44 MIL/uL — ABNORMAL LOW (ref 4.22–5.81)
RDW: 18.1 % — ABNORMAL HIGH (ref 11.5–15.5)
WBC: 13.7 K/uL — ABNORMAL HIGH (ref 4.0–10.5)
nRBC: 0 % (ref 0.0–0.2)

## 2024-03-16 LAB — GLUCOSE, CAPILLARY: Glucose-Capillary: 109 mg/dL — ABNORMAL HIGH (ref 70–99)

## 2024-03-16 LAB — RENAL FUNCTION PANEL
Albumin: 3.2 g/dL — ABNORMAL LOW (ref 3.5–5.0)
Anion gap: 15 (ref 5–15)
BUN: 83 mg/dL — ABNORMAL HIGH (ref 6–20)
CO2: 19 mmol/L — ABNORMAL LOW (ref 22–32)
Calcium: 9.4 mg/dL (ref 8.9–10.3)
Chloride: 104 mmol/L (ref 98–111)
Creatinine, Ser: 4.34 mg/dL — ABNORMAL HIGH (ref 0.61–1.24)
GFR, Estimated: 17 mL/min — ABNORMAL LOW
Glucose, Bld: 113 mg/dL — ABNORMAL HIGH (ref 70–99)
Phosphorus: 6 mg/dL — ABNORMAL HIGH (ref 2.5–4.6)
Potassium: 3.5 mmol/L (ref 3.5–5.1)
Sodium: 137 mmol/L (ref 135–145)

## 2024-03-16 MED ORDER — POTASSIUM CHLORIDE CRYS ER 10 MEQ PO TBCR
10.0000 meq | EXTENDED_RELEASE_TABLET | Freq: Every day | ORAL | 0 refills | Status: AC
  Filled 2024-03-16: qty 7, 7d supply, fill #0

## 2024-03-16 MED ORDER — METOPROLOL SUCCINATE ER 50 MG PO TB24
50.0000 mg | ORAL_TABLET | Freq: Every day | ORAL | 0 refills | Status: AC
  Filled 2024-03-16: qty 30, 30d supply, fill #0

## 2024-03-16 MED ORDER — AMLODIPINE BESYLATE 10 MG PO TABS
10.0000 mg | ORAL_TABLET | Freq: Every day | ORAL | 0 refills | Status: AC
  Filled 2024-03-16: qty 30, 30d supply, fill #0

## 2024-03-16 NOTE — Progress Notes (Signed)
 Reviewed discharge instructions with pt. Pt verbalizes understanding. Tele removed. Pt has belongings. Pts transported by wheelchair to lobby

## 2024-03-16 NOTE — Plan of Care (Signed)
" °  Problem: Education: Goal: Knowledge of General Education information will improve Description: Including pain rating scale, medication(s)/side effects and non-pharmacologic comfort measures 03/16/2024 1329 by Deland Slocumb K, RN Outcome: Adequate for Discharge 03/16/2024 0942 by Freeda Leon POUR, RN Outcome: Progressing   Problem: Health Behavior/Discharge Planning: Goal: Ability to manage health-related needs will improve 03/16/2024 1329 by Trisha Ken K, RN Outcome: Adequate for Discharge 03/16/2024 0942 by Freeda Leon POUR, RN Outcome: Progressing   Problem: Clinical Measurements: Goal: Ability to maintain clinical measurements within normal limits will improve Outcome: Adequate for Discharge Goal: Will remain free from infection Outcome: Adequate for Discharge Goal: Diagnostic test results will improve Outcome: Adequate for Discharge Goal: Respiratory complications will improve Outcome: Adequate for Discharge Goal: Cardiovascular complication will be avoided Outcome: Adequate for Discharge   Problem: Activity: Goal: Risk for activity intolerance will decrease Outcome: Adequate for Discharge   Problem: Nutrition: Goal: Adequate nutrition will be maintained Outcome: Adequate for Discharge   Problem: Coping: Goal: Level of anxiety will decrease Outcome: Adequate for Discharge   Problem: Elimination: Goal: Will not experience complications related to bowel motility Outcome: Adequate for Discharge Goal: Will not experience complications related to urinary retention Outcome: Adequate for Discharge   Problem: Pain Managment: Goal: General experience of comfort will improve and/or be controlled Outcome: Adequate for Discharge   Problem: Safety: Goal: Ability to remain free from injury will improve Outcome: Adequate for Discharge   Problem: Skin Integrity: Goal: Risk for impaired skin integrity will decrease Outcome: Adequate for Discharge   "

## 2024-03-16 NOTE — Plan of Care (Signed)
  Problem: Clinical Measurements: Goal: Ability to maintain clinical measurements within normal limits will improve Outcome: Progressing Goal: Will remain free from infection Outcome: Progressing Goal: Diagnostic test results will improve Outcome: Progressing Goal: Respiratory complications will improve Outcome: Progressing Goal: Cardiovascular complication will be avoided Outcome: Progressing   Problem: Elimination: Goal: Will not experience complications related to bowel motility Outcome: Progressing Goal: Will not experience complications related to urinary retention Outcome: Progressing   Problem: Pain Managment: Goal: General experience of comfort will improve and/or be controlled Outcome: Progressing

## 2024-03-16 NOTE — Discharge Summary (Signed)
 " Physician Discharge Summary   Patient: Darrell Campbell MRN: [id number] DOB: [birth year]  Admit date:     03/06/2024  Discharge date: 03/16/2024  Discharge Physician: AIDA CHO   PCP: Osker Tinnie HERO, FNP   Recommendations at discharge:   Follow-up with Dr. Dennise, nephrologist, within 1 week of discharge to monitor kidney function and electrolytes. Follow-up at [facility] sleep medicine department as soon as possible for sleep study  Discharge Diagnoses: Principal Problem:   Acute hypoxic respiratory failure (HCC) Active Problems:   Essential hypertension   Morbid obesity (HCC)   Iron deficiency anemia   Lymphedema   Prediabetes   OSA (obstructive sleep apnea)   Vitamin D  deficiency   AKI (acute kidney injury)   Hypokalemia  Resolved Problems:   * No resolved hospital problems. *  Hospital Course:  Darrell Sur A Davenport is a 34 y.o. male with medical history of hypertension, class III obesity, OSA, iron deficiency anemia, who presented to the hospital with worsening shortness of breath after event URI symptoms.   Labs significant for WBC 19.2, proBNP 3831, troponin 134, creatinine 4.26 on admission.     CT chest IMPRESSION: 1. Mosaic ground-glass opacity in the dependent lung bases may be atelectasis or sequelae of small airways disease. 2. Hepatic steatosis.     Assessment and Plan:   Abdominal pain, nausea and vomiting, ileus: Improved.  He is tolerating regular diet.  He is having regular bowel movements.     Acute hypoxic respiratory failure: Improved.  Oxygen saturation is normal on room air during the day.  Note from Izetta, occupational therapist, on 03/12/2024 showed that oxygen saturation remains at or above 90% throughout session with functional mobility.  However, he said that his oxygen drops at night.  He refused overnight oximetry test.  It was explained that he may need a overnight oximetry test to get approved for home oxygen. It was also explained that  he may need outpatient sleep study before he can be eligible for CPAP at home.  He verbalized understanding but is reluctant to have overnight oximetry test.     Acute heart failure with preserved EF: S/p treatment with IV Lasix . proBNP 3831. 2D echo showed EF estimated at 77 5%, grade 2 diastolic dysfunction.     AKI on CKD stage IIIb: Creatinine down from 6.42-5.75-5.59-5.01-4.34.   Creatinine had gone up from 4.68-5.43-6.42. Patient okay for discharge from nephrologist's standpoint. Losartan /HCTZ held because of AKI. Outpatient follow-up with nephrologist recommended within 1 week of discharge   Hypokalemia: Improved.  Potassium from 3.1-3.2-3.0-3.2-3.5.   He will be discharged on low-dose potassium 10 mEq daily.  Close follow-up with PCP and nephrologist recommended for monitoring.     Leukocytosis: Improved.  WBC from 14.10-29-15.9-15.8-13.7. Etiology unclear.  Continue to monitor.     Morbid obesity, suspected OSA: Patient has been referred to outpatient sleep medicine clinic for evaluation for CPAP machine.   He has also been advised to lose weight.    Comorbidities include hypertension, microcytic anemia, chronic lymphedema of left lower extremity     His condition has improved.  He is deemed stable for discharge to home today.            Consultants: General Surgeon, nephrologist Procedures performed: None Disposition: Home Diet recommendation:  Cardiac diet DISCHARGE MEDICATION: Allergies as of 03/16/2024       Reactions   Yellow Jacket Venom Swelling        Medication List     STOP  taking these medications    Cholecalciferol  1.25 MG (50000 UT) capsule   ferrous sulfate  325 (65 FE) MG tablet   folic acid  400 MCG tablet Commonly known as: FOLVITE    losartan -hydrochlorothiazide  50-12.5 MG tablet Commonly known as: HYZAAR   Vitamin D  (Ergocalciferol ) 1.25 MG (50000 UNIT) Caps capsule Commonly known as: DRISDOL        TAKE these medications     amLODipine  10 MG tablet Commonly known as: NORVASC  Take 1 tablet (10 mg total) by mouth daily. What changed:  how much to take how to take this when to take this additional instructions   metoprolol  succinate 50 MG 24 hr tablet Commonly known as: TOPROL -XL Take 1 tablet (50 mg total) by mouth daily. TAKE 1 TABLET BY MOUTH EVERY DAY WITH OR IMMEDIATELY FOLLOWING A MEAL What changed:  how much to take how to take this when to take this   potassium chloride  10 MEQ tablet Commonly known as: KLOR-CON  M Take 1 tablet (10 mEq total) by mouth daily. What changed:  medication strength how much to take when to take this               Durable Medical Equipment  (From admission, onward)           Start     Ordered   03/14/24 0000  For home use only DME oxygen       Question Answer Comment  Length of Need Lifetime   Mode or (Route) Nasal cannula   Liters per Minute 2   Frequency Only at night (stationary unit needed)   Oxygen delivery system: Gas   Oxygen delivery system: Concentrator      03/14/24 1131            Follow-up Information     Dennise Capri, MD. Schedule an appointment as soon as possible for a visit in 1 week(s).   Specialty: Nephrology Contact information: [address] [place] [address] [phone]         [facility]. Schedule an appointment as soon as possible for a visit in 1 week(s).   Specialty: Sleep Medicine Contact information: [address] [place]  [address] [phone]               Discharge Exam: Darrell Campbell   03/10/24 0500 03/11/24 0500 03/12/24 0518  Weight: (!) 174.6 kg (!) 174.2 kg (!) 230.3 kg   GEN: NAD SKIN: Warm and dry EYES: No pallor or icterus ENT: MMM CV: RRR PULM: CTA B ABD: soft, protuberant/grossly obese, NT, +BS CNS: AAO x 3, non focal EXT: Chronic left leg lymphedema   Condition at discharge:  good  The results of significant diagnostics from this hospitalization (including imaging, microbiology, ancillary and laboratory) are listed below for reference.   Imaging Studies: DG Abd 1 View Result Date: 03/11/2024 CLINICAL DATA:  Ileus. EXAM: ABDOMEN - 1 VIEW COMPARISON:  Radiographs yesterday FINDINGS: Stable gaseous distention of large and small bowel. No change from yesterday's exam. Enteric tube remains in place. Cholecystectomy clips in the right upper quadrant. No obvious free air. IMPRESSION: Unchanged gaseous distention of large and small bowel, consistent with ileus. Electronically Signed   By: Andrea Gasman M.D.   On: 03/11/2024 16:06   DG Abd 1 View Result Date: 03/10/2024 EXAM: 1 VIEW XRAY OF THE ABDOMEN 03/10/2024 07:13:00 AM COMPARISON: 03/09/2024 CLINICAL HISTORY: Ileus (HCC) [address]1250 FINDINGS: LINES, TUBES AND DEVICES: Enteric tube in place  with tip and side port projecting over the gastric body. BOWEL: Gaseous distension of loops of small bowel and colon. SOFT TISSUES: No abnormal calcifications. BONES: No acute fracture. IMPRESSION: 1. Enteric tube in place with marked gaseous distension of small bowel and colon. Electronically signed by: Michaeline Blanch MD 03/10/2024 11:18 AM EST RP Workstation: [id number]   DG Abd 1 View Result Date: 03/09/2024 EXAM: 1 VIEW XRAY OF THE ABDOMEN 03/09/2024 09:49:09 PM COMPARISON: None available. CLINICAL HISTORY: Encounter for nasogastric (NG) tube placement FINDINGS: LINES, TUBES AND DEVICES: Enteric tube in place with distal tip and side port terminating within the expected location of the stomach. BOWEL: Dilated loops of small bowel in visualized upper abdomen. SOFT TISSUES: No abnormal calcifications. BONES: No acute fracture. IMPRESSION: 1. Dilated loops of small bowel in the visualized upper abdomen. 2. Enteric tube in place with distal tip and side port terminating within the expected location of the stomach. Electronically signed by: Greig Pique MD 03/09/2024 10:20 PM EST RP Workstation: [id number]   DG Chest 1 View Result Date: 03/09/2024 EXAM: 1 VIEW(S) XRAY OF THE CHEST 03/09/2024 09:49:09 PM COMPARISON: 03/06/2024 CLINICAL HISTORY: Encounter for nasogastric (NG) tube placement FINDINGS: LINES, TUBES AND DEVICES: The nasogastric tube tip overlies the upper midline mediastinum. LUNGS AND PLEURA: Low lung volume. Mild pulmonary edema, possibly accentuated by low lung volume. No focal pulmonary opacity. No pleural effusion. No pneumothorax. HEART AND MEDIASTINUM: Stable moderate cardiomegaly. The nasogastric tube tip overlies the upper midline mediastinum. BONES AND SOFT TISSUES: No acute osseous abnormality. IMPRESSION: 1. Enteric tube tip projects over the upper midline mediastinum; recommend advancement and repeat radiograph to confirm gastric positioning. 2. Mild pulmonary edema, possibly accentuated by low lung volume. 3. Stable moderate cardiomegaly. Electronically signed by: Greig Pique MD 03/09/2024 10:19 PM EST RP Workstation: [id number]   CT ABDOMEN PELVIS WO CONTRAST Result Date: 03/09/2024 EXAM: CT ABDOMEN AND PELVIS WITHOUT CONTRAST 03/09/2024 01:33:31 PM TECHNIQUE: CT of the abdomen and pelvis was performed without the administration of intravenous contrast. Multiplanar reformatted images are provided for review. Automated exposure control, iterative reconstruction, and/or weight-based adjustment of the mA/kV was utilized to reduce the radiation dose to as low as reasonably achievable. COMPARISON: None available. CLINICAL HISTORY: Abdominal pain, acute, nonlocalized. FINDINGS: LOWER CHEST: Bibasilar atelectasis versus scarring. LIVER: The liver is unremarkable. GALLBLADDER AND BILE DUCTS: Status post cholecystectomy. No biliary ductal dilatation. SPLEEN: No acute abnormality. PANCREAS: No acute abnormality. ADRENAL GLANDS: No acute abnormality. KIDNEYS, URETERS AND BLADDER: No stones in the kidneys or ureters. No  hydronephrosis. No perinephric or periureteral stranding. Urinary bladder is unremarkable. GI AND BOWEL: The gastric lumen is distended. The mid to distal small bowel are dilated with gas and fluid. The small bowel measures up to 4 cm. Air-fluid levels are noted. No pneumatosis. No bowel thickening. The ascending colon and transverse colon are distended but not dilated with stool and gas. The descending colon and rectosigmoid colon are decompressed. No transition point. There is no bowel obstruction. PERITONEUM AND RETROPERITONEUM: No ascites. No free air. VASCULATURE: Aorta is normal in caliber. LYMPH NODES: No lymphadenopathy. REPRODUCTIVE ORGANS: Prostate unremarkable. BONES AND SOFT TISSUES: No acute osseous abnormality. Small to moderate volume umbilical fat-containing hernia. IMPRESSION: 1. Distended gastric lumen and dilated mid to distal small bowel with air-fluid levels, without pneumatosis or bowel wall thickening; no transition point identified, most consistent with ileus. Limited evaluation on this noncontrast study. 2. Small to moderate fat-containing umbilical hernia. Electronically signed by: Morgane Naveau MD 03/09/2024 02:16  PM EST RP Workstation: [id number]   US  Venous Img Lower Unilateral Left (DVT) Result Date: 03/08/2024 CLINICAL DATA:  34 year old male with left lower extremity pain. EXAM: LEFT LOWER EXTREMITY VENOUS DOPPLER ULTRASOUND TECHNIQUE: Gray-scale sonography with graded compression, as well as color Doppler and duplex ultrasound were performed to evaluate the left lower extremity deep venous systems from the level of the common femoral vein and including the common femoral, femoral, profunda femoral, popliteal and calf veins including the posterior tibial, peroneal and gastrocnemius veins when visible. Spectral Doppler was utilized to evaluate flow at rest and with distal augmentation maneuvers in the common femoral, femoral and popliteal veins. The contralateral common femoral  vein was also evaluated for comparison. COMPARISON:  None Available. FINDINGS: LEFT LOWER EXTREMITY Common Femoral Vein: No evidence of thrombus. Normal compressibility, respiratory phasicity and response to augmentation. Central Greater Saphenous Vein: No evidence of thrombus. Normal compressibility and flow on color Doppler imaging. Central Profunda Femoral Vein: No evidence of thrombus. Normal compressibility and flow on color Doppler imaging. Femoral Vein: No evidence of thrombus. Normal compressibility, respiratory phasicity and response to augmentation. Popliteal Vein: No evidence of thrombus. Normal compressibility, respiratory phasicity and response to augmentation. Calf Veins: No evidence of thrombus. Normal compressibility and flow on color Doppler imaging. Limited evaluation of the peroneal vein. Other Findings:  None. RIGHT LOWER EXTREMITY Common Femoral Vein: No evidence of thrombus. Normal compressibility, respiratory phasicity and response to augmentation. IMPRESSION: No evidence of left lower extremity deep venous thrombosis. Ester Sides, MD [facility] Electronically Signed   By: Ester Sides M.D.   On: 03/08/2024 15:05   US  RENAL Result Date: 03/08/2024 CLINICAL DATA:  Acute renal failure. EXAM: RENAL / URINARY TRACT ULTRASOUND COMPLETE COMPARISON:  None Available. FINDINGS: Right Kidney: Renal measurements: 10.4 x 5.2 x 5.7 cm = volume: 163 mL. Echogenicity within normal limits. No mass or hydronephrosis visualized. Left Kidney: Renal measurements: 11.5 x 5.2 x 5.4 cm = volume: 170 mL. Echogenicity within normal limits. No mass or hydronephrosis visualized. Bladder: Appears normal for degree of bladder distention. Other: None. IMPRESSION: No evidence for hydronephrosis. Electronically Signed   By: Camellia Candle M.D.   On: 03/08/2024 05:55   ECHOCARDIOGRAM COMPLETE Result Date: 03/07/2024    ECHOCARDIOGRAM REPORT   Patient Name:    Darrell Campbell Date of Exam: 03/06/2024 Medical Rec #:  [id number]         Height:       72.0 in Accession #:    [id number]        Weight:       385.0 lb Date of Birth:  [birth year]         BSA:          2.814 m Patient Age:    33 years          BP:           117/74 mmHg Patient Gender: M                 HR:           108 bpm. Exam Location:  [facility] Procedure: 2D Echo, Color Doppler, Cardiac Doppler and Intracardiac            Opacification Agent (Both Spectral and Color Flow Doppler were            utilized during procedure). Indications:     I51.7 Cardiomegaly  History:  Patient has no prior history of Echocardiogram examinations.                  Risk Factors:Sleep Apnea, Morbid Obesity and Hypertension.  Sonographer:     L. Thornton-Maynard Referring Phys:  [id number] MORENE BATHE Diagnosing Phys: Cara JONETTA Lovelace MD  Sonographer Comments: Image acquisition challenging due to patient body habitus. IMPRESSIONS  1. Left ventricular ejection fraction, by estimation, is 70 to 75%. The left ventricle has hyperdynamic function. The left ventricle has no regional wall motion abnormalities. There is moderate concentric left ventricular hypertrophy. Left ventricular diastolic parameters are consistent with Grade II diastolic dysfunction (pseudonormalization).  2. Right ventricular systolic function is normal. The right ventricular size is mildly enlarged. Mildly increased right ventricular wall thickness. There is normal pulmonary artery systolic pressure.  3. Left atrial size was mild to moderately dilated.  4. Right atrial size was mildly dilated.  5. The mitral valve is normal in structure. Trivial mitral valve regurgitation.  6. The aortic valve is normal in structure. Aortic valve regurgitation is not visualized. FINDINGS  Left Ventricle: Left ventricular ejection fraction, by estimation, is 70 to 75%. The left ventricle has hyperdynamic function. The left ventricle has no regional wall motion abnormalities.  Definity  contrast agent was given IV to delineate the left ventricular endocardial borders. Strain was performed and the global longitudinal strain is indeterminate. The left ventricular internal cavity size was normal in size. There is moderate concentric left ventricular hypertrophy. Left ventricular diastolic  parameters are consistent with Grade II diastolic dysfunction (pseudonormalization). Right Ventricle: The right ventricular size is mildly enlarged. Mildly increased right ventricular wall thickness. Right ventricular systolic function is normal. There is normal pulmonary artery systolic pressure. The tricuspid regurgitant velocity is 2.76 m/s, and with an assumed right atrial pressure of 3 mmHg, the estimated right ventricular systolic pressure is 33.5 mmHg. Left Atrium: Left atrial size was mild to moderately dilated. Right Atrium: Right atrial size was mildly dilated. Pericardium: There is no evidence of pericardial effusion. Mitral Valve: The mitral valve is normal in structure. Trivial mitral valve regurgitation. Tricuspid Valve: The tricuspid valve is normal in structure. Tricuspid valve regurgitation is not demonstrated. Aortic Valve: The aortic valve is normal in structure. Aortic valve regurgitation is not visualized. Aortic valve mean gradient measures 5.0 mmHg. Aortic valve peak gradient measures 8.9 mmHg. Aortic valve area, by VTI measures 4.08 cm. Pulmonic Valve: The pulmonic valve was not well visualized. Pulmonic valve regurgitation is not visualized. Aorta: The ascending aorta was not well visualized. IAS/Shunts: No atrial level shunt detected by color flow Doppler. Additional Comments: 3D was performed not requiring image post processing on an independent workstation and was indeterminate.  LEFT VENTRICLE PLAX 2D LVIDd:         4.70 cm   Diastology LVIDs:         3.40 cm   LV e' medial:    7.29 cm/s LV PW:         1.60 cm   LV E/e' medial:  12.2 LV IVS:        1.80 cm   LV e' lateral:    6.42 cm/s LVOT diam:     2.50 cm   LV E/e' lateral: 13.9 LV SV:         82 LV SV Index:   29 LVOT Area:     4.91 cm LV IVRT:       77 msec  RIGHT VENTRICLE RV S prime:  18.00 cm/s TAPSE (M-mode): 2.1 cm LEFT ATRIUM             Index        RIGHT ATRIUM           Index LA diam:        4.50 cm 1.60 cm/m   RA Area:     27.30 cm LA Vol (A2C):   66.5 ml 23.63 ml/m  RA Volume:   92.50 ml  32.87 ml/m LA Vol (A4C):   56.6 ml 20.11 ml/m LA Biplane Vol: 63.4 ml 22.53 ml/m  AORTIC VALVE                     PULMONIC VALVE AV Area (Vmax):    3.41 cm      PV Vmax:       1.31 m/s AV Area (Vmean):   3.63 cm      PV Peak grad:  6.9 mmHg AV Area (VTI):     4.08 cm AV Vmax:           149.50 cm/s AV Vmean:          101.500 cm/s AV VTI:            0.202 m AV Peak Grad:      8.9 mmHg AV Mean Grad:      5.0 mmHg LVOT Vmax:         104.00 cm/s LVOT Vmean:        75.100 cm/s LVOT VTI:          0.168 m LVOT/AV VTI ratio: 0.83  AORTA Ao Root diam: 3.60 cm Ao Asc diam:  3.50 cm MITRAL VALVE               TRICUSPID VALVE MV Area (PHT): 3.34 cm    TR Peak grad:   30.5 mmHg MV Decel Time: 227 msec    TR Vmax:        276.00 cm/s MV E velocity: 89.10 cm/s MV A velocity: 84.20 cm/s  SHUNTS MV E/A ratio:  1.06        Systemic VTI:  0.17 m                            Systemic Diam: 2.50 cm Cara JONETTA Lovelace MD Electronically signed by Cara JONETTA Lovelace MD Signature Date/Time: 03/07/2024/12:03:21 PM    Final    CT Chest Wo Contrast Result Date: 03/06/2024 CLINICAL DATA:  Shortness of breath. EXAM: CT CHEST WITHOUT CONTRAST TECHNIQUE: Multidetector CT imaging of the chest was performed following the standard protocol without IV contrast. RADIATION DOSE REDUCTION: This exam was performed according to the departmental dose-optimization program which includes automated exposure control, adjustment of the mA and/or kV according to patient size and/or use of iterative reconstruction technique. COMPARISON:  None Available. FINDINGS:  Cardiovascular: Heart size upper normal. Trace pericardial effusion evident. No thoracic aortic aneurysm. No substantial atherosclerotic calcification of the thoracic aorta. Mediastinum/Nodes: No mediastinal lymphadenopathy. No evidence for gross hilar lymphadenopathy although assessment is limited by the lack of intravenous contrast on the current study. The esophagus has normal imaging features. There is no axillary lymphadenopathy. Lungs/Pleura: Scattered calcified granulomata noted in both lungs. No overtly suspicious pulmonary nodule or mass. No focal airspace consolidation. No pleural effusion. Mosaic ground-glass opacity in the dependent lung bases may be atelectasis or sequelae of small airways disease. Upper Abdomen: The liver shows diffusely decreased attenuation  suggesting fat deposition. Musculoskeletal: No worrisome lytic or sclerotic osseous abnormality. IMPRESSION: 1. Mosaic ground-glass opacity in the dependent lung bases may be atelectasis or sequelae of small airways disease. 2. Hepatic steatosis. Electronically Signed   By: Camellia Candle M.D.   On: 03/06/2024 13:22   DG Chest Port 1 View Result Date: 03/06/2024 EXAM: 1 VIEW(S) XRAY OF THE CHEST 03/06/2024 10:41:00 AM COMPARISON: 07/21/2018. CLINICAL HISTORY: SOB (shortness of breath) FINDINGS: LINES, TUBES AND DEVICES: Multiple wires and leads project over the chest on the frontal radiograph. LUNGS AND PLEURA: Low lung volumes. Suboptimal evaluation of the inferior left hemithorax. No focal pulmonary opacity. No pleural effusion. No pneumothorax. HEART AND MEDIASTINUM: Mild cardiomegaly. BONES AND SOFT TISSUES: No acute osseous abnormality. LIMITATIONS/ARTIFACTS: Limited exam secondary to patient body habitus and AP portable technique. IMPRESSION: 1. No acute findings. 2. Limited AP radiograph given patient body habitus. 3. Cardiomegaly. Electronically signed by: Rockey Kilts MD 03/06/2024 11:14 AM EST RP Workstation: [id number]     Microbiology: Results for orders placed or performed during the hospital encounter of 03/06/24  Resp panel by RT-PCR (RSV, Flu A&B, Covid) Anterior Nasal Swab     Status: None   Collection Time: 03/06/24  9:26 AM   Specimen: Anterior Nasal Swab  Result Value Ref Range Status   SARS Coronavirus 2 by RT PCR NEGATIVE NEGATIVE Final    Comment: (NOTE) SARS-CoV-2 target nucleic acids are NOT DETECTED.  The SARS-CoV-2 RNA is generally detectable in upper respiratory specimens during the acute phase of infection. The lowest concentration of SARS-CoV-2 viral copies this assay can detect is 138 copies/mL. A negative result does not preclude SARS-Cov-2 infection and should not be used as the sole basis for treatment or other patient management decisions. A negative result may occur with  improper specimen collection/handling, submission of specimen other than nasopharyngeal swab, presence of viral mutation(s) within the areas targeted by this assay, and inadequate number of viral copies(<138 copies/mL). A negative result must be combined with clinical observations, patient history, and epidemiological information. The expected result is Negative.  Fact Sheet for Patients:  bloggercourse.com  Fact Sheet for Healthcare Providers:  seriousbroker.it  This test is no t yet approved or cleared by the [place]  FDA and  has been authorized for detection and/or diagnosis of SARS-CoV-2 by FDA under an Emergency Use Authorization (EUA). This EUA will remain  in effect (meaning this test can be used) for the duration of the COVID-19 declaration under Section 564(b)(1) of the Act, 21 U.S.C.section 360bbb-3(b)(1), unless the authorization is terminated  or revoked sooner.       Influenza A by PCR NEGATIVE NEGATIVE Final   Influenza B by PCR NEGATIVE NEGATIVE Final    Comment: (NOTE) The Xpert Xpress SARS-CoV-2/FLU/RSV plus assay is  intended as an aid in the diagnosis of influenza from Nasopharyngeal swab specimens and should not be used as a sole basis for treatment. Nasal washings and aspirates are unacceptable for Xpert Xpress SARS-CoV-2/FLU/RSV testing.  Fact Sheet for Patients: bloggercourse.com  Fact Sheet for Healthcare Providers: seriousbroker.it  This test is not yet approved or cleared by the [place]  FDA and has been authorized for detection and/or diagnosis of SARS-CoV-2 by FDA under an Emergency Use Authorization (EUA). This EUA will remain in effect (meaning this test can be used) for the duration of the COVID-19 declaration under Section 564(b)(1) of the Act, 21 U.S.C. section 360bbb-3(b)(1), unless the authorization is terminated or revoked.     Resp Syncytial Virus by  PCR NEGATIVE NEGATIVE Final    Comment: (NOTE) Fact Sheet for Patients: bloggercourse.com  Fact Sheet for Healthcare Providers: seriousbroker.it  This test is not yet approved or cleared by the [place]  FDA and has been authorized for detection and/or diagnosis of SARS-CoV-2 by FDA under an Emergency Use Authorization (EUA). This EUA will remain in effect (meaning this test can be used) for the duration of the COVID-19 declaration under Section 564(b)(1) of the Act, 21 U.S.C. section 360bbb-3(b)(1), unless the authorization is terminated or revoked.  Performed at [facility], [address] [place], [place] [address]   Blood culture (routine x 2)     Status: None   Collection Time: 03/06/24  9:52 AM   Specimen: BLOOD  Result Value Ref Range Status   Specimen Description BLOOD BLOOD RIGHT ARM  Final   Special Requests   Final    BOTTLES DRAWN AEROBIC AND ANAEROBIC Blood Culture results may not be optimal due to an inadequate volume of blood received in culture bottles   Culture   Final    NO GROWTH 5  DAYS Performed at [facility], [address] [place], [place] [address]    Report Status 03/11/2024 FINAL  Final  Blood culture (routine x 2)     Status: None   Collection Time: 03/06/24  9:52 AM   Specimen: BLOOD  Result Value Ref Range Status   Specimen Description BLOOD BLOOD LEFT HAND  Final   Special Requests   Final    BOTTLES DRAWN AEROBIC AND ANAEROBIC Blood Culture adequate volume   Culture   Final    NO GROWTH 5 DAYS Performed at [facility], [address] [place], [place] [address]    Report Status 03/11/2024 FINAL  Final    Labs: CBC: Recent Labs  Lab 03/11/24 1000 03/13/24 0340 03/14/24 0328 03/15/24 0453 03/16/24 0829  WBC 14.8* 18.0* 17.9* 15.8* 13.7*  NEUTROABS 11.6*  --   --   --   --   HGB 8.7* 8.3* 8.4* 8.4* 8.0*  HCT 28.8* 26.7* 27.7* 27.1* 25.7*  MCV 75.2* 74.0* 75.7* 74.7* 74.7*  PLT 333 387 441* 419* 402*   Basic Metabolic Panel: Recent Labs  Lab 03/12/24 0344 03/13/24 0340 03/14/24 0328 03/15/24 0453 03/16/24 0829  NA 137 137 137 135 137  K 3.1* 3.2* 3.0* 3.2* 3.5  CL 98 99 97* 98 104  CO2 24 22 22  21* 19*  GLUCOSE 94 115* 102* 111* 113*  BUN 67* 75* 82* 83* 83*  CREATININE 6.42* 5.75* 5.59* 5.01* 4.34*  CALCIUM  9.9 9.3 9.2 9.6 9.4  MG  --   --  2.2  --   --   PHOS  --   --  6.2* 6.0* 6.0*   Liver Function Tests: Recent Labs  Lab 03/14/24 0328 03/15/24 0453 03/16/24 0829  ALBUMIN 3.3* 3.4* 3.2*   CBG: Recent Labs  Lab 03/15/24 0813 03/15/24 1206 03/15/24 1700 03/15/24 2053 03/16/24 0832  GLUCAP 106* 142* 109* 102* 109*    Discharge time spent: greater than 30 minutes.  Signed: AIDA CHO, MD [facility] 03/16/2024 "

## 2024-03-16 NOTE — Progress Notes (Signed)
 " [facility]  ROUNDING NOTE   Subjective:   Patient sitting up in chair Alert and oriented Room air Tolerating meals  Urine output 2.7L Creatinine 4.34 (5.01) (5.59) (5.75) (6.42) (5.43) (4.68) (3.8)  Objective:  Vital signs in last 24 hours:  Temp:  [98.1 F (36.7 C)-99.2 F (37.3 C)] 98.2 F (36.8 C) (01/04 0900) Pulse Rate:  [62-110] 70 (01/04 0900) Resp:  [18-20] 18 (01/04 0900) BP: (123-163)/(82-109) 140/82 (01/04 0900) SpO2:  [83 %-100 %] 96 % (01/04 0900) FiO2 (%):  [36 %] 36 % (01/03 2359)  Weight change:  Filed Weights   03/10/24 0500 03/11/24 0500 03/12/24 0518  Weight: (!) 174.6 kg (!) 174.2 kg (!) 230.3 kg    Intake/Output: I/O last 3 completed shifts: In: 600 [P.O.:600] Out: 3850 [Urine:3850]   Intake/Output this shift:  No intake/output data recorded.  Physical Exam: General: Large body habitus  Head: Normocephalic  Eyes: Anicteric  Lungs:  BiPAP  Heart: Regular rate   Abdomen:  Soft, nontender, obese  Extremities:  +++ peripheral edema, L>R  Neurologic:  drowsy  Skin: No lesions  Access: None    Basic Metabolic Panel: Recent Labs  Lab 03/12/24 0344 03/13/24 0340 03/14/24 0328 03/15/24 0453 03/16/24 0829  NA 137 137 137 135 137  K 3.1* 3.2* 3.0* 3.2* 3.5  CL 98 99 97* 98 104  CO2 24 22 22  21* 19*  GLUCOSE 94 115* 102* 111* 113*  BUN 67* 75* 82* 83* 83*  CREATININE 6.42* 5.75* 5.59* 5.01* 4.34*  CALCIUM  9.9 9.3 9.2 9.6 9.4  MG  --   --  2.2  --   --   PHOS  --   --  6.2* 6.0* 6.0*    Liver Function Tests: Recent Labs  Lab 03/14/24 0328 03/15/24 0453 03/16/24 0829  ALBUMIN 3.3* 3.4* 3.2*   No results for input(s): LIPASE, AMYLASE in the last 168 hours. No results for input(s): AMMONIA in the last 168 hours.  CBC: Recent Labs  Lab 03/11/24 1000 03/13/24 0340 03/14/24 0328 03/15/24 0453 03/16/24 0829  WBC 14.8* 18.0* 17.9* 15.8* 13.7*  NEUTROABS 11.6*  --   --   --   --   HGB 8.7* 8.3* 8.4* 8.4*  8.0*  HCT 28.8* 26.7* 27.7* 27.1* 25.7*  MCV 75.2* 74.0* 75.7* 74.7* 74.7*  PLT 333 387 441* 419* 402*    Cardiac Enzymes: No results for input(s): CKTOTAL, CKMB, CKMBINDEX, TROPONINI in the last 168 hours.  BNP: Invalid input(s): POCBNP  CBG: Recent Labs  Lab 03/15/24 0813 03/15/24 1206 03/15/24 1700 03/15/24 2053 03/16/24 0832  GLUCAP 106* 142* 109* 102* 109*    Microbiology: Results for orders placed or performed during the hospital encounter of 03/06/24  Resp panel by RT-PCR (RSV, Flu A&B, Covid) Anterior Nasal Swab     Status: None   Collection Time: 03/06/24  9:26 AM   Specimen: Anterior Nasal Swab  Result Value Ref Range Status   SARS Coronavirus 2 by RT PCR NEGATIVE NEGATIVE Final    Comment: (NOTE) SARS-CoV-2 target nucleic acids are NOT DETECTED.  The SARS-CoV-2 RNA is generally detectable in upper respiratory specimens during the acute phase of infection. The lowest concentration of SARS-CoV-2 viral copies this assay can detect is 138 copies/mL. A negative result does not preclude SARS-Cov-2 infection and should not be used as the sole basis for treatment or other patient management decisions. A negative result may occur with  improper specimen collection/handling, submission of specimen other than  nasopharyngeal swab, presence of viral mutation(s) within the areas targeted by this assay, and inadequate number of viral copies(<138 copies/mL). A negative result must be combined with clinical observations, patient history, and epidemiological information. The expected result is Negative.  Fact Sheet for Patients:  bloggercourse.com  Fact Sheet for Healthcare Providers:  seriousbroker.it  This test is no t yet approved or cleared by the [place]  FDA and  has been authorized for detection and/or diagnosis of SARS-CoV-2 by FDA under an Emergency Use Authorization (EUA). This EUA will remain   in effect (meaning this test can be used) for the duration of the COVID-19 declaration under Section 564(b)(1) of the Act, 21 U.S.C.section 360bbb-3(b)(1), unless the authorization is terminated  or revoked sooner.       Influenza A by PCR NEGATIVE NEGATIVE Final   Influenza B by PCR NEGATIVE NEGATIVE Final    Comment: (NOTE) The Xpert Xpress SARS-CoV-2/FLU/RSV plus assay is intended as an aid in the diagnosis of influenza from Nasopharyngeal swab specimens and should not be used as a sole basis for treatment. Nasal washings and aspirates are unacceptable for Xpert Xpress SARS-CoV-2/FLU/RSV testing.  Fact Sheet for Patients: bloggercourse.com  Fact Sheet for Healthcare Providers: seriousbroker.it  This test is not yet approved or cleared by the [place]  FDA and has been authorized for detection and/or diagnosis of SARS-CoV-2 by FDA under an Emergency Use Authorization (EUA). This EUA will remain in effect (meaning this test can be used) for the duration of the COVID-19 declaration under Section 564(b)(1) of the Act, 21 U.S.C. section 360bbb-3(b)(1), unless the authorization is terminated or revoked.     Resp Syncytial Virus by PCR NEGATIVE NEGATIVE Final    Comment: (NOTE) Fact Sheet for Patients: bloggercourse.com  Fact Sheet for Healthcare Providers: seriousbroker.it  This test is not yet approved or cleared by the [place]  FDA and has been authorized for detection and/or diagnosis of SARS-CoV-2 by FDA under an Emergency Use Authorization (EUA). This EUA will remain in effect (meaning this test can be used) for the duration of the COVID-19 declaration under Section 564(b)(1) of the Act, 21 U.S.C. section 360bbb-3(b)(1), unless the authorization is terminated or revoked.  Performed at [facility], [address] [place], [place] [address]   Blood  culture (routine x 2)     Status: None   Collection Time: 03/06/24  9:52 AM   Specimen: BLOOD  Result Value Ref Range Status   Specimen Description BLOOD BLOOD RIGHT ARM  Final   Special Requests   Final    BOTTLES DRAWN AEROBIC AND ANAEROBIC Blood Culture results may not be optimal due to an inadequate volume of blood received in culture bottles   Culture   Final    NO GROWTH 5 DAYS Performed at [facility], [address] [place], [place] [address]    Report Status 03/11/2024 FINAL  Final  Blood culture (routine x 2)     Status: None   Collection Time: 03/06/24  9:52 AM   Specimen: BLOOD  Result Value Ref Range Status   Specimen Description BLOOD BLOOD LEFT HAND  Final   Special Requests   Final    BOTTLES DRAWN AEROBIC AND ANAEROBIC Blood Culture adequate volume   Culture   Final    NO GROWTH 5 DAYS Performed at [facility], [address], [place], [place] [address]    Report Status 03/11/2024 FINAL  Final    Coagulation Studies: No results for input(s): LABPROT, INR  in the last 72 hours.  Urinalysis: No results for input(s): COLORURINE, LABSPEC, PHURINE, GLUCOSEU, HGBUR, BILIRUBINUR, KETONESUR, PROTEINUR, UROBILINOGEN, NITRITE, LEUKOCYTESUR in the last 72 hours.  Invalid input(s): APPERANCEUR     Imaging: No results found.    Medications:    promethazine  (PHENERGAN ) injection (IM or IVPB) 12.5 mg (03/09/24 1419)    amLODipine   10 mg Oral Daily   famotidine   20 mg Oral BID   gabapentin   100 mg Oral TID   heparin   5,000 Units Subcutaneous Q8H   simethicone   80 mg Oral QID   sodium chloride  flush  3 mL Intravenous Q12H   acetaminophen  **OR** acetaminophen , alum & mag hydroxide-simeth, ipratropium-albuterol , labetalol , lidocaine , methocarbamol  (ROBAXIN ) injection, ondansetron  **OR** ondansetron  (ZOFRAN ) IV, promethazine  (PHENERGAN ) injection (IM or IVPB), senna-docusate  Assessment/ Plan:  Mr. Kaiyon Hynes  is a 34 y.o.  male  with hypertension, sleep apnea, obesity and anemia, who was admitted to [facility] on 03/06/2024 for Paroxysmal atrial fibrillation (HCC) [I48.0] SOB (shortness of breath) [R06.02] Upper respiratory tract infection, unspecified type [J06.9] Congestive heart failure, unspecified HF chronicity, unspecified heart failure type (HCC) [I50.9] Acute hypoxic respiratory failure (HCC) [J96.01]    Acute Kidney Injury on chronic kidney disease stage IIIB with proteinuria: baseline creatinine 2.1, GFR of 41. Renal ultrasound shows no obstruction. No recent IV contrast exposure. Concern for progression of chronic kidney disease. Losartan  held.  Creatinine continues to improve with decent urine output. No acute indication for dialysis.  Continue to avoid nephrotoxic agents and therapies along with hypotension at this time.   Lab Results  Component Value Date   CREATININE 4.34 (H) 03/16/2024   CREATININE 5.01 (H) 03/15/2024   CREATININE 5.59 (H) 03/14/2024     Intake/Output Summary (Last 24 hours) at 03/16/2024 1210 Last data filed at 03/16/2024 0400 Gross per 24 hour  Intake 600 ml  Output 2100 ml  Net -1500 ml      Hypertension with chronic kidney disease: with acute exacerbation of chronic diastolic congestive heart failure.  Home regimen of losartan , hydrochlorothiazide , and amlodipine . However unclear of his compliance.  IV diuretics as needed Continue amlodipine     Anemia on chronic kidney disease: with iron deficiency.  - oral iron supplements - do not recommend IV iron with active infection - Hgb slowly decreasing, Will continue to monitor. Lab Results  Component Value Date   HGB 8.0 (L) 03/16/2024     Hypokalemia: on hydrochlorothiazide  as outpatient. Magnesium  level replaced.  - Potassium 3.5. Oral supplementation ordered by primary team         LOS: 10 Loyola Santino 1/4/202612:10 PM  "

## 2024-03-16 NOTE — Plan of Care (Signed)
   Problem: Education: Goal: Knowledge of General Education information will improve Description Including pain rating scale, medication(s)/side effects and non-pharmacologic comfort measures Outcome: Progressing   Problem: Health Behavior/Discharge Planning: Goal: Ability to manage health-related needs will improve Outcome: Progressing
